# Patient Record
Sex: Male | Born: 1970 | Race: White | Hispanic: No | Marital: Single | Smoking: Never smoker
Health system: Southern US, Community
[De-identification: ages and names within clinical notes are randomized; demographics above are authoritative.]

## PROBLEM LIST (undated history)

## (undated) DIAGNOSIS — F329 Major depressive disorder, single episode, unspecified: Secondary | ICD-10-CM

## (undated) DIAGNOSIS — F419 Anxiety disorder, unspecified: Secondary | ICD-10-CM

## (undated) DIAGNOSIS — K219 Gastro-esophageal reflux disease without esophagitis: Secondary | ICD-10-CM

## (undated) DIAGNOSIS — F32A Depression, unspecified: Secondary | ICD-10-CM

## (undated) DIAGNOSIS — Q8500 Neurofibromatosis, unspecified: Secondary | ICD-10-CM

## (undated) HISTORY — DX: Major depressive disorder, single episode, unspecified: F32.9

## (undated) HISTORY — DX: Anxiety disorder, unspecified: F41.9

## (undated) HISTORY — DX: Depression, unspecified: F32.A

## (undated) HISTORY — DX: Gastro-esophageal reflux disease without esophagitis: K21.9

## (undated) HISTORY — DX: Neurofibromatosis, unspecified: Q85.00

---

## 2006-03-11 ENCOUNTER — Ambulatory Visit: Payer: Self-pay | Admitting: Gastroenterology

## 2006-03-11 LAB — HM COLONOSCOPY

## 2013-07-27 ENCOUNTER — Ambulatory Visit: Payer: Self-pay | Admitting: Family Medicine

## 2013-08-10 ENCOUNTER — Ambulatory Visit: Payer: Self-pay | Admitting: Family Medicine

## 2014-11-19 LAB — PSA

## 2014-12-23 ENCOUNTER — Telehealth: Payer: Self-pay | Admitting: Unknown Physician Specialty

## 2014-12-23 NOTE — Telephone Encounter (Signed)
Pt's mother called stated refill orders did not go through and need to be resubmitted to [facility]:  Buspirone Omeprazole  Thanks.

## 2014-12-24 ENCOUNTER — Other Ambulatory Visit: Payer: Self-pay | Admitting: Unknown Physician Specialty

## 2014-12-24 MED ORDER — OMEPRAZOLE 20 MG PO CPDR: 20.0000 mg | DELAYED_RELEASE_CAPSULE | Freq: Every day | ORAL | Status: DC

## 2014-12-24 MED ORDER — BUSPIRONE HCL 10 MG PO TABS: 10.0000 mg | ORAL_TABLET | Freq: Two times a day (BID) | ORAL | Status: DC

## 2014-12-24 NOTE — Telephone Encounter (Signed)
Medications sent to pharmacy to [facility]

## 2015-02-04 ENCOUNTER — Telehealth: Payer: Self-pay | Admitting: Unknown Physician Specialty

## 2015-02-04 NOTE — Telephone Encounter (Signed)
Will route to [place] to see if she can suggest anything.

## 2015-02-04 NOTE — Telephone Encounter (Signed)
Called and let patient's mother know what Elnita MaxwellCheryl suggested.

## 2015-02-04 NOTE — Telephone Encounter (Signed)
Pt's mother called stated pt has a severe dry cough, wants to know if Elnita MaxwellCheryl can recommend something for him to take. We have no appointments available until Tuesday of next week. Please call Rocco Sereneamela Boggs @ [phone]. Thanks.

## 2015-02-04 NOTE — Telephone Encounter (Signed)
Try OTC meds but also dark honey and sometimes dark chocolate works.  If it is so severe he needs to be seen, should got to urgent care.

## 2015-02-18 ENCOUNTER — Telehealth: Payer: Self-pay | Admitting: Unknown Physician Specialty

## 2015-02-18 MED ORDER — OMEPRAZOLE 20 MG PO CPDR: 20.0000 mg | DELAYED_RELEASE_CAPSULE | Freq: Every day | ORAL | Status: DC

## 2015-02-18 NOTE — Telephone Encounter (Signed)
Routing to provider  

## 2015-02-18 NOTE — Telephone Encounter (Addendum)
Already at 90.  Will send again

## 2015-02-18 NOTE — Addendum Note (Signed)
Addended by: Gabriel Cirri on: 02/18/2015 05:13 PM   Modules accepted: Orders

## 2015-02-18 NOTE — Telephone Encounter (Signed)
Pt's mother called stated there is an issue with her son's medication and we need to contact the pt's pharmacy @ [phone]. Omeprazole quantities need to be increased to a 90 supply as the pharmacy can not do 30 day supply. Thanks. Pt has not had this medication since 02/06/15.

## 2015-02-21 NOTE — Telephone Encounter (Signed)
done

## 2015-02-22 ENCOUNTER — Telehealth: Payer: Self-pay | Admitting: Unknown Physician Specialty

## 2015-02-22 MED ORDER — OMEPRAZOLE 20 MG PO CPDR: 20.0000 mg | DELAYED_RELEASE_CAPSULE | Freq: Every day | ORAL | Status: DC

## 2015-02-22 NOTE — Telephone Encounter (Signed)
Pt's mother called stated pharmacy has not received the RX for Prilosec. Pharm is [facility]. Thanks.

## 2015-02-23 ENCOUNTER — Telehealth: Payer: Self-pay | Admitting: Unknown Physician Specialty

## 2015-02-23 MED ORDER — OMEPRAZOLE 20 MG PO CPDR: 20.0000 mg | DELAYED_RELEASE_CAPSULE | Freq: Every day | ORAL | Status: DC

## 2015-02-23 NOTE — Telephone Encounter (Signed)
It sounds like they did a complete work-up at the urgent care.  I would refer to ENT.  I'll put the order in if OK with them

## 2015-02-23 NOTE — Telephone Encounter (Signed)
Routing to provider to see what she suggests.

## 2015-02-23 NOTE — Telephone Encounter (Signed)
Called and spoke to patient's mother, Rinaldo Cloud. She stated that the patient has not been taking his omeprazole and she believes this is why he is coughing because this has happened before. [facility] and they stated that the patient's insurance will only cover a 90 day supply of medication over a 180 day period. The patient was given 30 days worth in June so he is oly allowed 60 more pills for the next 5 months. [facility] suggested we do a prior authorization so the patient can get more to take every day.

## 2015-02-23 NOTE — Telephone Encounter (Signed)
pts mom called in and would like a call back because the cough her son had since 7/15. She took him to urgent care across the street and they did xrays and blood work and she's concerned and is wondering why he's not getting any better even after getting the cough medicines that were suggested on the 17th of July.

## 2015-02-24 NOTE — Telephone Encounter (Signed)
Pts mom called and said [facility] needs a quantity approval from cheryl.

## 2015-02-25 ENCOUNTER — Telehealth: Payer: Self-pay | Admitting: Unknown Physician Specialty

## 2015-02-25 NOTE — Telephone Encounter (Signed)
Pts mother called, she is tired of dealing with [facility] and would like [facility] [place]

## 2015-02-25 NOTE — Telephone Encounter (Signed)
[facility] [place] 

## 2015-03-02 ENCOUNTER — Telehealth: Payer: Self-pay

## 2015-03-02 MED ORDER — OMEPRAZOLE 20 MG PO CPDR: 20.0000 mg | DELAYED_RELEASE_CAPSULE | Freq: Every day | ORAL | Status: DC

## 2015-03-02 NOTE — Telephone Encounter (Signed)
Patient's mother called stating that she has been trying to talk to someone at [facility] but cannot get a human on the phone, its only the recording. Patient's mother is wanting the [facility]ep[facility]ole [facility] [place] because the patient has been out since 02/06/15 and really needs the medication. Patient's mother apologizes for all of the confusion.

## 2015-03-14 ENCOUNTER — Telehealth: Payer: Self-pay | Admitting: Unknown Physician Specialty

## 2015-03-14 MED ORDER — OMEPRAZOLE 20 MG PO CPDR: 20.0000 mg | DELAYED_RELEASE_CAPSULE | Freq: Every day | ORAL | Status: DC

## 2015-03-14 MED ORDER — OMEPRAZOLE 20 MG PO CPDR: 40.0000 mg | DELAYED_RELEASE_CAPSULE | Freq: Every day | ORAL | Status: DC

## 2015-03-14 NOTE — Telephone Encounter (Signed)
pts mom would like a call back regarding her sons acid reflux medication.

## 2015-03-14 NOTE — Telephone Encounter (Signed)
Spoke to patients mother. [facility] [place] and said that they stated they need a new rx sent in for the Omeprazole. It was just written on 03/02/15, but patient's mother stated she just talked to them.

## 2015-03-14 NOTE — Telephone Encounter (Signed)
[facility] needed [facility] RX for Omeprazole. [facility] [place]. Thanks

## 2015-03-22 ENCOUNTER — Telehealth: Payer: Self-pay

## 2015-03-22 NOTE — Telephone Encounter (Signed)
Patient's mother called and stated that they are still having problems with the patient's omeprazole. She does not want anything to do with [facility] anymore so can I call and discontinue the rx that they have? Mother also stated that [facility] [place] [facility] and stated they have 2 different rx's for the omeprazole, one says take one a day and the other says take two. They need clarification on that. The one in the chart says to take 2 capsules a day.

## 2015-03-22 NOTE — Telephone Encounter (Signed)
Yes you can.  I believe he takes 2 a day.

## 2015-03-22 NOTE — Telephone Encounter (Signed)
Called [facility] and dc the rx's for omeprazole. [facility] and told them the right dosage the patient is supposed to be taking and they stated that the rx is going to need a prior authorization.

## 2015-03-23 NOTE — Telephone Encounter (Signed)
Called and let patient's mother know that we are waiting on the prior authorization for the medication.

## 2015-03-23 NOTE — Telephone Encounter (Signed)
Prior authorization paperwork for the medication was faxed in, we filled it out, and sent it back.

## 2015-08-09 ENCOUNTER — Telehealth: Payer: Self-pay

## 2015-08-09 NOTE — Telephone Encounter (Signed)
Called and let patient's mother know he should just be taking one daily.

## 2015-08-09 NOTE — Telephone Encounter (Signed)
Patient's mother called asking about omeprazole prescription. It states that patient is supposed to take 2 capsules daily but they thought he was just supposed to take one capsule daily. They would like to know if he is supposed to be taking one or two capsules daily.

## 2015-08-09 NOTE — Telephone Encounter (Signed)
The once a day he has been taking is fine

## 2015-09-05 DIAGNOSIS — F329 Major depressive disorder, single episode, unspecified: Secondary | ICD-10-CM

## 2015-09-05 DIAGNOSIS — F32A Depression, unspecified: Secondary | ICD-10-CM

## 2015-09-05 DIAGNOSIS — F419 Anxiety disorder, unspecified: Principal | ICD-10-CM

## 2015-09-05 DIAGNOSIS — K219 Gastro-esophageal reflux disease without esophagitis: Secondary | ICD-10-CM | POA: Insufficient documentation

## 2015-09-05 DIAGNOSIS — E7800 Pure hypercholesterolemia, unspecified: Secondary | ICD-10-CM

## 2015-09-06 ENCOUNTER — Encounter: Payer: Self-pay | Admitting: Unknown Physician Specialty

## 2015-09-06 ENCOUNTER — Ambulatory Visit (INDEPENDENT_AMBULATORY_CARE_PROVIDER_SITE_OTHER): Payer: BC Managed Care – PPO | Admitting: Unknown Physician Specialty

## 2015-09-06 VITALS — BP 146/79 | HR 87 | Temp 98.0°F | Ht 69.5 in | Wt 211.2 lb

## 2015-09-06 DIAGNOSIS — Z23 Encounter for immunization: Secondary | ICD-10-CM

## 2015-09-06 DIAGNOSIS — R059 Cough, unspecified: Secondary | ICD-10-CM

## 2015-09-06 DIAGNOSIS — R05 Cough: Secondary | ICD-10-CM

## 2015-09-06 MED ORDER — BENZONATATE 200 MG PO CAPS: 200.0000 mg | ORAL_CAPSULE | Freq: Two times a day (BID) | ORAL | Status: DC | PRN

## 2015-09-06 MED ORDER — AZITHROMYCIN 250 MG PO TABS: ORAL_TABLET | ORAL | Status: DC

## 2015-09-06 MED ORDER — PREDNISONE 20 MG PO TABS: 20.0000 mg | ORAL_TABLET | Freq: Every day | ORAL | Status: DC

## 2015-09-06 NOTE — Progress Notes (Signed)
BP 146/79 mmHg  Pulse 87  Temp(Src) 98 F (36.7 C)  Ht 5' 9.5" (1.765 m)  Wt 211 lb 3.2 oz (95.8 kg)  BMI 30.75 kg/m2  SpO2 98%   Subjective:    Patient ID: Joshua Krause, male    DOB: 10-13-1970, 45 y.o.   MRN: [id number]  HPI: Joshua Krause is a 45 y.o. male  Chief Complaint  Patient presents with  . URI    pt states he has a cough, gagging, and congestion. States this has been going on since 07/22/15. Mom states pt was given antibiotics at Urgent care that seemed to help, but then symptoms came back. Patient has tried taking delsym, mucinex DM, and mometasone nasal spray.    URI  This is a recurrent (Never completely better from previous visit) problem. The current episode started more than 1 month ago. The problem has been gradually worsening. There has been no fever. Associated symptoms include congestion, coughing, ear pain and rhinorrhea. Pertinent negatives include no headaches, joint pain, rash or swollen glands. Treatments tried: antibiotics as above. The treatment provided mild relief.   States had an x-ray x2.  He does not smoke but his mother who he lives with does. His shop "is full of walking pneumonia."  Relevant past medical, surgical, family and social history reviewed and updated as indicated. Interim medical history since our last visit reviewed. Allergies and medications reviewed and updated.  Review of Systems  HENT: Positive for congestion, ear pain and rhinorrhea.   Respiratory: Positive for cough.   Musculoskeletal: Negative for joint pain.  Skin: Negative for rash.  Neurological: Negative for headaches.    Per HPI unless specifically indicated above     Objective:    BP 146/79 mmHg  Pulse 87  Temp(Src) 98 F (36.7 C)  Ht 5' 9.5" (1.765 m)  Wt 211 lb 3.2 oz (95.8 kg)  BMI 30.75 kg/m2  SpO2 98%  Wt Readings from Last 3 Encounters:  09/06/15 211 lb 3.2 oz (95.8 kg)  11/19/14 209 lb (94.802 kg)    Physical Exam   Constitutional: He is oriented to person, place, and time. He appears well-developed and well-nourished. No distress.  HENT:  Head: Normocephalic and atraumatic.  Right Ear: Tympanic membrane and ear canal normal.  Left Ear: Tympanic membrane and ear canal normal.  Nose: Rhinorrhea present. Right sinus exhibits no maxillary sinus tenderness and no frontal sinus tenderness. Left sinus exhibits no maxillary sinus tenderness and no frontal sinus tenderness.  Mouth/Throat: Uvula is midline. Posterior oropharyngeal edema present.  Eyes: Conjunctivae and lids are normal. Right eye exhibits no discharge. Left eye exhibits no discharge. No scleral icterus.  Neck: Neck supple.  Cardiovascular: Normal rate, regular rhythm and normal heart sounds.   Pulmonary/Chest: Effort normal and breath sounds normal. No respiratory distress.  Abdominal: Normal appearance. There is no splenomegaly or hepatomegaly.  Musculoskeletal: Normal range of motion.  Neurological: He is alert and oriented to person, place, and time.  Skin: Skin is warm, dry and intact. No rash noted. No pallor.  Psychiatric: He has a normal mood and affect. His behavior is normal. Judgment and thought content normal.  Nursing note and vitals reviewed.   Results for orders placed or performed in visit on 09/05/15  PSA  Result Value Ref Range   PSA from PP   HM COLONOSCOPY  Result Value Ref Range   HM Colonoscopy from PP       Assessment & Plan:  Problem List Items Addressed This Visit    None    Visit Diagnoses    Immunization due    -  Primary    Relevant Orders    Flu Vaccine QUAD 36+ mos IM (Completed)    Cough        r/o walking pneumonia.  Prednisone fro probable reactive airway        Follow up plan: Return if symptoms worsen or fail to improve.

## 2015-09-19 ENCOUNTER — Telehealth: Payer: Self-pay | Admitting: Unknown Physician Specialty

## 2015-09-19 DIAGNOSIS — I517 Cardiomegaly: Secondary | ICD-10-CM

## 2015-09-19 NOTE — Telephone Encounter (Signed)
Called and left patient's mother a voicemail asking if she could please return my call.

## 2015-09-19 NOTE — Telephone Encounter (Signed)
Patient's mother called back. I let her know what was going on with the patient's x-ray results. She asked if she would be able to sign the release and I told her yes because she is the patient's power of attorney. She stated she would come by in the morning to sign the release.

## 2015-09-19 NOTE — Telephone Encounter (Signed)
Pts mom called in and would like to have Joshua Krause take a look at the xrays taken at [facility] to see if he may possibly have an enlarged heart. While at urgent care they asked the pts mom if he he has ever been told that he had an enlarged heart and she would like to speak with his PCP to find out what to do next. He may possibly have allergies and currently has an ear infection. Ms [name] would just like a call back to know the next step and find out if her son will need to go see a cardiologist.

## 2015-09-19 NOTE — Telephone Encounter (Signed)
Routing to provider  

## 2015-09-19 NOTE — Telephone Encounter (Signed)
I can't see a chest x-ray being done at [facility].  Can we call for it?

## 2015-09-19 NOTE — Telephone Encounter (Signed)
[facility] to see if they could fax [place] a copy of the patient's chest x-ray. They stated that we had to fax them a release of information signed by the patient first. They provided me their fax number so I will call the patient to see if they can come in to sign the release.

## 2015-09-20 NOTE — Telephone Encounter (Signed)
Patient's mother came by, signed the release, and it was faxed to [facility]. I we do not receive results within the next 2 days, I will call them and find out what is going on.

## 2015-09-22 NOTE — Telephone Encounter (Signed)
Called patient's mother to let her know the status of the x-ray results. I told her that we would call her once we got them and Elnita Maxwell had a chance to look at them.

## 2015-09-22 NOTE — Telephone Encounter (Signed)
[facility]. They stated that they mailed out the patient's chest-xray results on 09/20/15. I asked how long it usually took to get them and they stated we should get them today. If we do not get them by the end of the week, I will call and see what is going on.

## 2015-09-23 NOTE — Telephone Encounter (Signed)
We got the patient's x-ray results on a disc. Joshua MaxwellCheryl brought the disc to me and said that she cannot see the disc. I tried to call [facility] to see if they could fax me paper copies of the x-ray results but there was no answer. The phone rang and then went to a busy signal. I will try to call again later.

## 2015-09-23 NOTE — Telephone Encounter (Signed)
[facility] They transferred me to the medical records department and told me that they faxed over the patient's information we requested on 09/20/15. I told her that we had not gotten anything through fax on this patient. She read out the fax number that started with [phone] and I asked her if she could re-fax the information using the [phone] number. She stated she would re-send the information now.

## 2015-09-28 NOTE — Telephone Encounter (Signed)
[facility] because I have not gotten any faxes from them yet. They said that the only thing they could send us was the disc that they have already sent. They said they cannot send paper copies of the chest x-ray radiology reports. I asked if they knew why we could not see the reports on care everywhere. They said only certain things cross over to us for us to be able to see. The lady I spoke to transferred me to somebody else (I cant remember what department it was). But the last person I spoke with gave me a number to the medical records information department to call, [phone]. So I will call them and see what we can do.

## 2015-09-29 NOTE — Telephone Encounter (Signed)
Called and informed patient's mother of everything that is going on. I let her know that I am currently waiting on [facility].

## 2015-09-29 NOTE — Telephone Encounter (Signed)
Called the number that was provided yesterday. I gave the lady a run down of what was going on. I told her that the patient's mother signed a release form that we faxed to them on 09/19/15. I told her that we got the disc that was sent to us but we cannot see a disc. I asked if we could get the radiology report faxed to us and the lady asked for our fax number, my name, and said she would send it over now.

## 2015-09-29 NOTE — Telephone Encounter (Signed)
Results from [facility] were received through the fax. I will give them to [facility] when she comes in tomorrow morning for her to review.

## 2015-09-30 NOTE — Telephone Encounter (Signed)
Report was given to [facility] and she stated she would call the patient's mother. [phone]- Rocco SerenePamela Boggs

## 2015-09-30 NOTE — Telephone Encounter (Signed)
Discussed with mother chest x-ray showing mild cardiomegaly.  Set up for echocardiogram

## 2015-10-06 ENCOUNTER — Ambulatory Visit
Admission: RE | Admit: 2015-10-06 | Discharge: 2015-10-06 | Disposition: A | Discharge status: Home / Self Care | Payer: BC Managed Care – PPO | Source: Ambulatory Visit | Attending: Unknown Physician Specialty | Admitting: Unknown Physician Specialty

## 2015-10-06 DIAGNOSIS — I517 Cardiomegaly: Secondary | ICD-10-CM

## 2015-10-06 NOTE — Progress Notes (Signed)
*  PRELIMINARY RESULTS* Echocardiogram 2D Echocardiogram has been performed.  Georgann HousekeeperJerry R Hege 10/06/2015, 11:14 AM

## 2015-10-07 ENCOUNTER — Telehealth: Payer: Self-pay | Admitting: Family Medicine

## 2015-10-07 NOTE — Telephone Encounter (Signed)
Called and spoke to patient's mother. I gave her the results and she stated that the patient still has a cough and wonders if it is allergies. She wanted me to schedule patient an [facility]. So I scheduled an appointment for 10/24/15.

## 2015-10-07 NOTE — Telephone Encounter (Signed)
Please let him know that his echo came back normal. Thanks!

## 2015-10-19 ENCOUNTER — Other Ambulatory Visit: Payer: Self-pay | Admitting: Unknown Physician Specialty

## 2015-10-19 NOTE — Telephone Encounter (Signed)
Cheryl's pt with rx request

## 2015-10-19 NOTE — Telephone Encounter (Signed)
Your patient 

## 2015-10-24 ENCOUNTER — Ambulatory Visit (INDEPENDENT_AMBULATORY_CARE_PROVIDER_SITE_OTHER): Payer: BC Managed Care – PPO | Admitting: Unknown Physician Specialty

## 2015-10-24 ENCOUNTER — Encounter: Payer: Self-pay | Admitting: Unknown Physician Specialty

## 2015-10-24 VITALS — BP 121/74 | HR 90 | Temp 98.2°F | Ht 69.2 in | Wt 213.2 lb

## 2015-10-24 DIAGNOSIS — J302 Other seasonal allergic rhinitis: Secondary | ICD-10-CM | POA: Diagnosis not present

## 2015-10-24 NOTE — Progress Notes (Signed)
BP 121/74 mmHg  Pulse 90  Temp(Src) 98.2 F (36.8 C)  Ht 5' 9.2" (1.758 m)  Wt 213 lb 3.2 oz (96.707 kg)  BMI 31.29 kg/m2  SpO2 96%   Subjective:    Patient ID: Joshua Krause, male    DOB: [birth year], 45 y.o.   MRN: [id number]  HPI: Joshua SharperChristopher B Ignatowski is a 45 y.o. male  Chief Complaint  Patient presents with  . Allergies   URI  This is a new problem. The current episode started more than 1 month ago. The problem has been gradually improving. There has been no fever. Associated symptoms include congestion, sneezing and wheezing. Pertinent negatives include no diarrhea, ear pain, headaches, nausea, plugged ear sensation, rash, rhinorrhea, sinus pain, sore throat or swollen glands. He has tried antihistamine for the symptoms. The treatment provided moderate relief.    Takes Allegra every day with no issues.   Relevant past medical, surgical, family and social history reviewed and updated as indicated. Interim medical history since our last visit reviewed. Allergies and medications reviewed and updated.  Review of Systems  HENT: Positive for congestion and sneezing. Negative for ear pain, rhinorrhea and sore throat.   Respiratory: Positive for wheezing.   Gastrointestinal: Negative for nausea and diarrhea.  Skin: Negative for rash.  Neurological: Negative for headaches.    Per HPI unless specifically indicated above     Objective:    BP 121/74 mmHg  Pulse 90  Temp(Src) 98.2 F (36.8 C)  Ht 5' 9.2" (1.758 m)  Wt 213 lb 3.2 oz (96.707 kg)  BMI 31.29 kg/m2  SpO2 96%  Wt Readings from Last 3 Encounters:  10/24/15 213 lb 3.2 oz (96.707 kg)  09/06/15 211 lb 3.2 oz (95.8 kg)  11/19/14 209 lb (94.802 kg)    Physical Exam  Constitutional: He is oriented to person, place, and time. He appears well-developed and well-nourished. No distress.  HENT:  Head: Normocephalic and atraumatic.  Right Ear: Tympanic membrane and ear canal normal.  Left Ear: Tympanic membrane  and ear canal normal.  Nose: Mucosal edema and rhinorrhea present. Right sinus exhibits no maxillary sinus tenderness and no frontal sinus tenderness. Left sinus exhibits no maxillary sinus tenderness and no frontal sinus tenderness.  Mouth/Throat: Uvula is midline. Mucous membranes are pale.  Eyes: Conjunctivae and lids are normal. Right eye exhibits no discharge. Left eye exhibits no discharge. No scleral icterus.  Neck: Neck supple.  Cardiovascular: Normal rate, regular rhythm and normal heart sounds.   Pulmonary/Chest: Effort normal and breath sounds normal. No respiratory distress.  Abdominal: Normal appearance. There is no splenomegaly or hepatomegaly.  Musculoskeletal: Normal range of motion.  Neurological: He is alert and oriented to person, place, and time.  Skin: Skin is warm, dry and intact. No rash noted. No pallor.  Psychiatric: He has a normal mood and affect. His behavior is normal. Judgment and thought content normal.  Nursing note and vitals reviewed.   Results for orders placed or performed in visit on 09/05/15  PSA  Result Value Ref Range   PSA from PP   HM COLONOSCOPY  Result Value Ref Range   HM Colonoscopy from PP       Assessment & Plan:   Problem List Items Addressed This Visit    None    Visit Diagnoses    Seasonal allergies    -  Primary    Patient doing well with daily Allegra. Continue present medication.  Follow up plan: Return if symptoms worsen or fail to improve.

## 2015-11-07 ENCOUNTER — Other Ambulatory Visit: Payer: Self-pay

## 2015-11-07 MED ORDER — FEXOFENADINE HCL 180 MG PO TABS: 180.0000 mg | ORAL_TABLET | Freq: Every day | ORAL | Status: DC

## 2015-11-07 NOTE — Telephone Encounter (Signed)
I had called patient's mother about an encounter in her chart. While on the phone, she asked about a refill for her son's fexofenadine. She stated that they thought it was being sent at his recent appointment but it was not. [facility]

## 2015-12-16 ENCOUNTER — Other Ambulatory Visit: Payer: Self-pay | Admitting: Unknown Physician Specialty

## 2016-02-20 ENCOUNTER — Other Ambulatory Visit: Payer: Self-pay | Admitting: Unknown Physician Specialty

## 2016-02-28 ENCOUNTER — Other Ambulatory Visit: Payer: Self-pay | Admitting: Unknown Physician Specialty

## 2016-03-19 ENCOUNTER — Telehealth: Payer: Self-pay

## 2016-03-19 MED ORDER — BUSPIRONE HCL 10 MG PO TABS: 10.0000 mg | ORAL_TABLET | Freq: Two times a day (BID) | ORAL | 12 refills | Status: DC

## 2016-03-19 MED ORDER — OMEPRAZOLE 20 MG PO CPDR: 20.0000 mg | DELAYED_RELEASE_CAPSULE | Freq: Two times a day (BID) | ORAL | 12 refills | Status: DC

## 2016-03-19 NOTE — Telephone Encounter (Signed)
Pharmacy sent a fax requesting refills on omeprazole and buspirone. Only one month of these meds are being sent in at a time. Is this how they are supposed to be?

## 2016-05-26 ENCOUNTER — Other Ambulatory Visit: Payer: Self-pay | Admitting: Unknown Physician Specialty

## 2016-06-18 ENCOUNTER — Other Ambulatory Visit: Payer: Self-pay | Admitting: Family Medicine

## 2016-07-13 ENCOUNTER — Other Ambulatory Visit: Payer: Self-pay | Admitting: Unknown Physician Specialty

## 2016-07-13 NOTE — Telephone Encounter (Signed)
Refill request

## 2016-08-13 ENCOUNTER — Telehealth: Payer: Self-pay | Admitting: Unknown Physician Specialty

## 2016-08-13 NOTE — Telephone Encounter (Signed)
Called and spoke to patient's mother. She stated that she would like for [facility] to look over the patient's notes [facility] She stated that [facility]. Patient's mother also wanted me to cancel her appointment with Elnita Maxwellheryl on Wednesday so she could stay with him so I did that. I was looking in the patient's chart and noticed that I did not see an [facility]. I asked patient's mother about this and she stated that she took the patient to the walk in beside the hopsital [facility] I explained to the patient's mother that we are supposed to see patient's within 30 days of being discharged from the hospital. She stated that she would call when the patient is discharged and schedule this follow up and she would talk with Elnita Maxwellheryl then. Patient's mother stated that for now, she just wanted Elnita MaxwellCheryl to look over the patient's records when she could.

## 2016-08-13 NOTE — Telephone Encounter (Signed)
Pts mom would like a call back regarding his visit [facility]. [facility] but they are performing tests that she unfamiliar with and would like your input on what's going on.

## 2016-08-16 ENCOUNTER — Telehealth: Payer: Self-pay | Admitting: Unknown Physician Specialty

## 2016-08-16 NOTE — Telephone Encounter (Signed)
Dr.Johnson, the other medicine in question is the Buspirone, not ibuprofen. Is it ok for him to resume taking that or should he wait until he finishes the new antibiotic?

## 2016-08-16 NOTE — Telephone Encounter (Signed)
According to [facility], patient was to stop taking cipro, start taking cefpodoxime (vantin) BID for 12 days, and continue taking bupropion and omeprazole. Is this correct? [facility] and Dr. Laural BenesJohnson in Cheryl's absence since this is concerning a patient taking his medications.

## 2016-08-16 NOTE — Telephone Encounter (Signed)
Patient was at the hospital on Sunday, they were [facility] that he should not be taking his regular meds while taking his antibiotic.  She is wanting to know if this is correct.  Please advise.  Thank You Clydie BraunKaren

## 2016-08-16 NOTE — Telephone Encounter (Signed)
He should keep taking his omeprazole and ibuprofen as needed. Should not still take the cipro and replace that with the [facility] (the [facility]) The he can see Elnita Maxwellheryl on Tuesday.

## 2016-08-16 NOTE — Telephone Encounter (Signed)
Patient's mom notified.

## 2016-08-16 NOTE — Telephone Encounter (Signed)
That's fine to take

## 2016-08-17 NOTE — Telephone Encounter (Signed)
Notes reviewed. Thanks

## 2016-08-21 ENCOUNTER — Encounter: Payer: Self-pay | Admitting: Unknown Physician Specialty

## 2016-08-21 ENCOUNTER — Ambulatory Visit (INDEPENDENT_AMBULATORY_CARE_PROVIDER_SITE_OTHER): Payer: BC Managed Care – PPO | Admitting: Unknown Physician Specialty

## 2016-08-21 VITALS — BP 128/64 | HR 60 | Temp 98.1°F | Ht 70.5 in | Wt 216.4 lb

## 2016-08-21 DIAGNOSIS — N12 Tubulo-interstitial nephritis, not specified as acute or chronic: Secondary | ICD-10-CM

## 2016-08-21 DIAGNOSIS — Z23 Encounter for immunization: Secondary | ICD-10-CM

## 2016-08-21 LAB — UA/M W/RFLX CULTURE, ROUTINE
BILIRUBIN UA: NEGATIVE
GLUCOSE, UA: NEGATIVE
Ketones, UA: NEGATIVE
LEUKOCYTES UA: NEGATIVE
Nitrite, UA: NEGATIVE
PH UA: 6 (ref 5.0–7.5)
PROTEIN UA: NEGATIVE
RBC, UA: NEGATIVE
SPEC GRAV UA: 1.015 (ref 1.005–1.030)
Urobilinogen, Ur: 0.2 mg/dL (ref 0.2–1.0)

## 2016-08-21 NOTE — Patient Instructions (Addendum)
Tdap Vaccine (Tetanus, Diphtheria and Pertussis): What You Need to Know 1. Why get vaccinated? Tetanus, diphtheria and pertussis are very serious diseases. Tdap vaccine can protect us from these diseases. And, Tdap vaccine given to pregnant women can protect newborn babies against pertussis. TETANUS (Lockjaw) is rare in the [place] today. It causes painful muscle tightening and stiffness, usually all over the body.  It can lead to tightening of muscles in the head and neck so you can't open your mouth, swallow, or sometimes even breathe. Tetanus kills about 1 out of 10 people who are infected even after receiving the best medical care.  DIPHTHERIA is also rare in the [place] today. It can cause a thick coating to form in the back of the throat.  It can lead to breathing problems, heart failure, paralysis, and death.  PERTUSSIS (Whooping Cough) causes severe coughing spells, which can cause difficulty breathing, vomiting and disturbed sleep.  It can also lead to weight loss, incontinence, and rib fractures. Up to 2 in 100 adolescents and 5 in 100 adults with pertussis are hospitalized or have complications, which could include pneumonia or death.  These diseases are caused by bacteria. Diphtheria and pertussis are spread from person to person through secretions from coughing or sneezing. Tetanus enters the body through cuts, scratches, or wounds. Before vaccines, as many as 200,000 cases of diphtheria, 200,000 cases of pertussis, and hundreds of cases of tetanus, were reported in the [place] each year. Since vaccination began, reports of cases for tetanus and diphtheria have dropped by about 99% and for pertussis by about 80%. 2. Tdap vaccine Tdap vaccine can protect adolescents and adults from tetanus, diphtheria, and pertussis. One dose of Tdap is routinely given at age 11 or 12. People who did not get Tdap at that age should get it as soon as possible. Tdap is especially  important for healthcare professionals and anyone having close contact with a baby younger than 12 months. Pregnant women should get a dose of Tdap during every pregnancy, to protect the newborn from pertussis. Infants are most at risk for severe, life-threatening complications from pertussis. Another vaccine, called Td, protects against tetanus and diphtheria, but not pertussis. A Td booster should be given every 10 years. Tdap may be given as one of these boosters if you have never gotten Tdap before. Tdap may also be given after a severe cut or burn to prevent tetanus infection. Your doctor or the person giving you the vaccine can give you more information. Tdap may safely be given at the same time as other vaccines. 3. Some people should not get this vaccine  A person who has ever had a life-threatening allergic reaction after a previous dose of any diphtheria, tetanus or pertussis containing vaccine, OR has a severe allergy to any part of this vaccine, should not get Tdap vaccine. Tell the person giving the vaccine about any severe allergies.  Anyone who had coma or long repeated seizures within 7 days after a childhood dose of DTP or DTaP, or a previous dose of Tdap, should not get Tdap, unless a cause other than the vaccine was found. They can still get Td.  Talk to your doctor if you: ? have seizures or another nervous system problem, ? had severe pain or swelling after any vaccine containing diphtheria, tetanus or pertussis, ? ever had a condition called Guillain-Barr Syndrome (GBS), ? aren't feeling well on the day the shot is scheduled. 4. Risks With any medicine, including   vaccines, there is a chance of side effects. These are usually mild and go away on their own. Serious reactions are also possible but are rare. Most people who get Tdap vaccine do not have any problems with it. Mild problems following Tdap: (Did not interfere with activities)  Pain where the shot was given (about  3 in 4 adolescents or 2 in 3 adults)  Redness or swelling where the shot was given (about 1 person in 5)  Mild fever of at least 100.4F (up to about 1 in 25 adolescents or 1 in 100 adults)  Headache (about 3 or 4 people in 10)  Tiredness (about 1 person in 3 or 4)  Nausea, vomiting, diarrhea, stomach ache (up to 1 in 4 adolescents or 1 in 10 adults)  Chills, sore joints (about 1 person in 10)  Body aches (about 1 person in 3 or 4)  Rash, swollen glands (uncommon)  Moderate problems following Tdap: (Interfered with activities, but did not require medical attention)  Pain where the shot was given (up to 1 in 5 or 6)  Redness or swelling where the shot was given (up to about 1 in 16 adolescents or 1 in 12 adults)  Fever over 102F (about 1 in 100 adolescents or 1 in 250 adults)  Headache (about 1 in 7 adolescents or 1 in 10 adults)  Nausea, vomiting, diarrhea, stomach ache (up to 1 or 3 people in 100)  Swelling of the entire arm where the shot was given (up to about 1 in 500).  Severe problems following Tdap: (Unable to perform usual activities; required medical attention)  Swelling, severe pain, bleeding and redness in the arm where the shot was given (rare).  Problems that could happen after any vaccine:  People sometimes faint after a medical procedure, including vaccination. Sitting or lying down for about 15 minutes can help prevent fainting, and injuries caused by a fall. Tell your doctor if you feel dizzy, or have vision changes or ringing in the ears.  Some people get severe pain in the shoulder and have difficulty moving the arm where a shot was given. This happens very rarely.  Any medication can cause a severe allergic reaction. Such reactions from a vaccine are very rare, estimated at fewer than 1 in a million doses, and would happen within a few minutes to a few hours after the vaccination. As with any medicine, there is a very remote chance of a vaccine  causing a serious injury or death. The safety of vaccines is always being monitored. For more information, visit: www.cdc.gov/vaccinesafety/ 5. What if there is a serious problem? What should I look for? Look for anything that concerns you, such as signs of a severe allergic reaction, very high fever, or unusual behavior. Signs of a severe allergic reaction can include hives, swelling of the face and throat, difficulty breathing, a fast heartbeat, dizziness, and weakness. These would usually start a few minutes to a few hours after the vaccination. What should I do?  If you think it is a severe allergic reaction or other emergency that can't wait, call 9-1-1 or get the person to the nearest hospital. Otherwise, call your doctor.  Afterward, the reaction should be reported to the Vaccine Adverse Event Reporting System (VAERS). Your doctor might file this report, or you can do it yourself through the VAERS web site at www.vaers.hhs.gov, or by calling 1-800-822-7967. ? VAERS does not give medical advice. 6. The National Vaccine Injury Compensation Program The National   Vaccine Injury Compensation Program (VICP) is a federal program that was created to compensate people who may have been injured by certain vaccines. Persons who believe they may have been injured by a vaccine can learn about the program and about filing a claim by calling 1-800-338-2382 or visiting the VICP website at www.hrsa.gov/vaccinecompensation. There is a time limit to file a claim for compensation. 7. How can I learn more?  Ask your doctor. He or she can give you the vaccine package insert or suggest other sources of information.  Call your local or state health department.  Contact the Centers for Disease Control and Prevention (CDC): ? Call 1-800-232-4636 (1-800-CDC-INFO) or ? Visit CDC's website at www.cdc.gov/vaccines CDC Tdap Vaccine VIS (09/15/13) This information is not intended to replace advice given to you by your  health care provider. Make sure you discuss any questions you have with your health care provider. Document Released: 01/08/2012 Document Revised: 03/29/2016 Document Reviewed: 03/29/2016 Elsevier Interactive Patient Education  2017 Elsevier Inc.  

## 2016-08-21 NOTE — Progress Notes (Signed)
   BP 128/64 (BP Location: Left Arm, Patient Position: Sitting, Cuff Size: Large)   Pulse 60   Temp 98.1 F (36.7 C)   Ht 5' 10.5" (1.791 m)   Wt 216 lb 6.4 oz (98.2 kg)   SpO2 98%   BMI 30.61 kg/m    Subjective:    Patient ID: Joshua Krause, male    DOB: 1971-04-10, 46 y.o.   MRN: [id number]  HPI: Joshua Krause is a 46 y.o. male  Chief Complaint  Patient presents with  . Hospitalization Follow-up    pt states [facility] in his kidneys. Pt and his mother state that his medications say kidney problems on side effects.    Pyelonephritis [facility] fiver.  He was diagnosed with pyelonephritis.  He completed his antibiotics and states his fever is resolved.     Relevant past medical, surgical, family and social history reviewed and updated as indicated. Interim medical history since our last visit reviewed. Allergies and medications reviewed and updated.  Review of Systems  Per HPI unless specifically indicated above     Objective:    BP 128/64 (BP Location: Left Arm, Patient Position: Sitting, Cuff Size: Large)   Pulse 60   Temp 98.1 F (36.7 C)   Ht 5' 10.5" (1.791 m)   Wt 216 lb 6.4 oz (98.2 kg)   SpO2 98%   BMI 30.61 kg/m   Wt Readings from Last 3 Encounters:  08/21/16 216 lb 6.4 oz (98.2 kg)  10/24/15 213 lb 3.2 oz (96.7 kg)  09/06/15 211 lb 3.2 oz (95.8 kg)    Physical Exam  Constitutional: He is oriented to person, place, and time. He appears well-developed and well-nourished. No distress.  HENT:  Head: Normocephalic and atraumatic.  Eyes: Conjunctivae and lids are normal. Right eye exhibits no discharge. Left eye exhibits no discharge. No scleral icterus.  Neck: Normal range of motion. Neck supple. No JVD present. Carotid bruit is not present.  Cardiovascular: Normal rate, regular rhythm and normal heart sounds.   Pulmonary/Chest: Effort normal and breath sounds normal. No respiratory distress.    Abdominal: Normal appearance. There is no splenomegaly or hepatomegaly.  Musculoskeletal: Normal range of motion.  Neurological: He is alert and oriented to person, place, and time.  Skin: Skin is warm, dry and intact. No rash noted. No pallor.  Psychiatric: He has a normal mood and affect. His behavior is normal. Judgment and thought content normal.   ER notes reviewed.  CMP normal    Assessment & Plan:   Problem List Items Addressed This Visit    None    Visit Diagnoses    Need for diphtheria-tetanus-pertussis (Tdap) vaccine, adult/adolescent    -  Primary   Relevant Orders   Tdap vaccine greater than or equal to 7yo IM (Completed)   Pyelonephritis       Refer to Urology for evaluation of possible cause.     Relevant Medications   cefpodoxime (VANTIN) 200 MG tablet   Other Relevant Orders   UA/M w/rflx Culture, Routine   Ambulatory referral to Urology       Follow up plan: Return if symptoms worsen or fail to improve.

## 2016-09-07 ENCOUNTER — Encounter: Payer: Self-pay | Admitting: Urology

## 2016-09-07 ENCOUNTER — Other Ambulatory Visit
Admission: RE | Admit: 2016-09-07 | Discharge: 2016-09-07 | Disposition: A | Discharge status: Home / Self Care | Payer: BC Managed Care – PPO | Source: Ambulatory Visit | Attending: Urology | Admitting: Urology

## 2016-09-07 ENCOUNTER — Ambulatory Visit (INDEPENDENT_AMBULATORY_CARE_PROVIDER_SITE_OTHER): Payer: BC Managed Care – PPO | Admitting: Urology

## 2016-09-07 VITALS — BP 144/77 | HR 54 | Ht 68.0 in | Wt 214.0 lb

## 2016-09-07 DIAGNOSIS — R3129 Other microscopic hematuria: Secondary | ICD-10-CM

## 2016-09-07 DIAGNOSIS — N138 Other obstructive and reflux uropathy: Secondary | ICD-10-CM

## 2016-09-07 DIAGNOSIS — N401 Enlarged prostate with lower urinary tract symptoms: Secondary | ICD-10-CM

## 2016-09-07 DIAGNOSIS — N12 Tubulo-interstitial nephritis, not specified as acute or chronic: Secondary | ICD-10-CM | POA: Diagnosis not present

## 2016-09-07 LAB — URINALYSIS, COMPLETE (UACMP) WITH MICROSCOPIC
BILIRUBIN URINE: NEGATIVE
Bacteria, UA: NONE SEEN
Glucose, UA: NEGATIVE mg/dL
HGB URINE DIPSTICK: NEGATIVE
Ketones, ur: NEGATIVE mg/dL
Nitrite: NEGATIVE
PROTEIN: NEGATIVE mg/dL
Specific Gravity, Urine: 1.015 (ref 1.005–1.030)
pH: 5.5 (ref 5.0–8.0)

## 2016-09-07 LAB — BLADDER SCAN AMB NON-IMAGING: Scan Result: 86

## 2016-09-07 NOTE — Progress Notes (Signed)
09/07/2016 10:41 AM   Joshua Krause 02-22-1971 [id number]  Referring provider: Kathrine Haddock, NP [place], [place] [address]  Chief Complaint  Patient presents with  . New Patient (Initial Visit)    Pyelonephritis referred by Dr. 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  Patient stated that prior to the hospitalization he was having urgency and frequency associated with a fever on and off for 2 days.  He was tried to manage his symptoms at home alternating ibuprofen and Tylenol but then sought treatment endocrine normal clinic's urgent care.  He was given Rocephin IM and oral ciprofloxacin. UA was positive for > 50 WBC's and > 50 RBC's.  Urine culture from the urgent care growing Escherichia coli.  Patient could not tolerate the ciprofloxacin and presented to [facility] for further treatment.  At [facility] he  met 4/4 SIRS criteria with WBC 24.9. CT A/P withno stone or concern for hydronephrosis. UA appeared dirty, but culture was negative likely given previous treatment. Patient markedly improved after 24 hrs of IV antibiotics. Transitioned to oral third generation cephalosporin given response (cefpodoxime) and will plan to complete a 14 day course.   Today, he complains of nocturia 1. This is baseline symptomatology. He is no longer experiencing frequency, urgency, flank pain or fevers. His I PSS score was 1/2.  His PVR was 86 mL. He denies any gross hematuria, dysuria or suprapubic pain.  He is not having chills, nausea or vomiting.        IPSS    Row Name 09/07/16 1000         International Prostate Symptom Score   How often have you had the sensation of not emptying your bladder? Not at All     How often have you had to urinate less than every two hours? Not at All     How often have you found you stopped and started  again several times when you urinated? Not at All     How often have you found it difficult to postpone urination? Not at All     How often have you had a weak urinary stream? Not at All     How often have you had to strain to start urination? Not at All     How many times did you typically get up at night to urinate? 1 Time     Total IPSS Score 1       Quality of Life due to urinary symptoms   If you were to spend the rest of your life with your urinary condition just the way it is now how would you feel about that? Mostly Satisfied        Score:  1-7 Mild 8-19 Moderate 20-35 Severe  PMH: Past Medical History:  Diagnosis Date  . Anxiety   . Depression   . GERD (gastroesophageal reflux disease)   . Neurofibromatosis [facility])     Surgical History: History reviewed. No pertinent surgical history.  Home Medications:  Allergies as of 09/07/2016      Reactions   Meloxicam Other (See Comments)   Stomach pain      Medication List       Accurate as of 09/07/16 10:41 AM. Always use your most recent med list.          busPIRone 10 MG tablet Commonly known as:  BUSPAR Take 1 tablet (10 mg total) by mouth 2 (two) times daily.   fexofenadine 180 MG tablet Commonly known as:  ALLEGRA TAKE 1 TABLET (180 MG TOTAL) BY MOUTH DAILY.   omeprazole 20 MG capsule Commonly known as:  PRILOSEC TAKE 2 CAPSULES (40 MG TOTAL) BY MOUTH DAILY.       Allergies:  Allergies  Allergen Reactions  . Meloxicam Other (See Comments)    Stomach pain    Family History: Family History  Problem Relation Age of Onset  . Diabetes Maternal Grandmother   . Hypertension Maternal Grandmother   . Prostate cancer Neg Hx   . Kidney cancer Neg Hx     Social History:  reports that he has never smoked. His smokeless tobacco use includes Chew. He reports that he drinks alcohol. He reports that he does not use drugs.  ROS: UROLOGY Frequent Urination?: No Hard to postpone urination?:  No Burning/pain with urination?: No Get up at night to urinate?: Yes Leakage of urine?: No Urine stream starts and stops?: No Trouble starting stream?: No Do you have to strain to urinate?: No Blood in urine?: No Urinary tract infection?: No Sexually transmitted disease?: No Injury to kidneys or bladder?: No Painful intercourse?: No Weak stream?: No Erection problems?: No Penile pain?: No  Gastrointestinal Nausea?: Yes Vomiting?: Yes Indigestion/heartburn?: No Diarrhea?: No Constipation?: No  Constitutional Fever: No Night sweats?: No Weight loss?: No Fatigue?: No  Skin Skin rash/lesions?: No Itching?: No  Eyes Blurred vision?: No Double vision?: No  Ears/Nose/Throat Sore throat?: No Sinus problems?: No  Hematologic/Lymphatic Swollen glands?: No Easy bruising?: No  Cardiovascular Leg swelling?: No Chest pain?: No  Respiratory Cough?: No Shortness of breath?: No  Endocrine Excessive thirst?: No  Musculoskeletal Back pain?: No Joint pain?: No  Neurological Headaches?: No Dizziness?: No  Psychologic Depression?: No Anxiety?: No  Physical Exam: BP (!) 144/77   Pulse (!) 54   Ht 5' 8"  (1.727 m)   Wt 214 lb (97.1 kg)   BMI 32.54 kg/m   Constitutional: Well nourished. Alert and oriented, No acute distress. HEENT: [place] AT, moist mucus membranes. Trachea midline, no masses. Cardiovascular: No clubbing, cyanosis, or edema. Respiratory: Normal respiratory effort, no increased work of breathing. GI: Abdomen is soft, non tender, non distended, no abdominal masses. Liver and spleen not palpable.  No hernias appreciated.  Stool sample for occult testing is not indicated.   GU: No CVA tenderness.  No bladder fullness or masses.  Patient with circumcised phallus.  Urethral meatus is patent.  No penile discharge. No penile lesions or rashes. Scrotum without lesions, cysts, rashes and/or edema.  Testicles are located scrotally bilaterally. No masses are  appreciated in the testicles. Left and right epididymis are normal. Rectal: Patient with  normal sphincter tone. Anus and perineum without scarring or rashes. No rectal masses are appreciated. Prostate is approximately 45 grams, no nodules are appreciated. Seminal vesicles are normal. Skin: No rashes, bruises or suspicious lesions. Lymph: No cervical or inguinal adenopathy. Neurologic: Grossly intact, no focal deficits, moving all 4 extremities. Psychiatric: Normal mood and affect.  Laboratory Data:  Lab Results  Component Value Date   PSA from [facility] 11/19/2014   Urinalysis Not available at time of visit  Pertinent Imaging: Results for KEINAN, BROUILLET (MRN [id number]) as of 09/07/2016 10:37  Ref. Range 09/07/2016 10:10  Scan Result Unknown [id number]    Assessment & Plan:    1. Pyelonephritis  - resolved  2. Microscopic hematuria  -  associated with UTI  - recheck UA today, if hematuria persists will pursue hematuria workup  3. BPH with LUTS  - IPSS score is 1/2  - Continue conservative management, avoiding bladder irritants and timed voiding's  - RTC in 12 months for IPS'S, PVR and exam   - BLADDER SCAN AMB NON-IMAGING   Return in about 1 year (around 09/07/2017) for IPSS, PVR and exam.  These notes generated with voice recognition software. I apologize for typographical errors.  Zara Council, [facility] [address] [place], [place] [address] [phone]

## 2016-09-10 ENCOUNTER — Telehealth: Payer: Self-pay

## 2016-09-10 DIAGNOSIS — R3129 Other microscopic hematuria: Secondary | ICD-10-CM

## 2016-09-10 NOTE — Telephone Encounter (Signed)
-----   Message from Harle BattiestShannon A McGowan, PA-C sent at 09/09/2016 10:41 AM EST ----- Please let the patient and his mother know that there was microscopic blood in his urine.  We need to schedule a CT Urogram at this time.

## 2016-09-10 NOTE — Telephone Encounter (Signed)
Patient's mother notified and order placed for CTscan to be done in [place], and transferred to the front to make a follow up with Carollee HerterShannon to go over CTscan results per shannon hold off on cysto for now.

## 2016-09-13 ENCOUNTER — Ambulatory Visit
Admission: RE | Admit: 2016-09-13 | Discharge: 2016-09-13 | Disposition: A | Discharge status: Home / Self Care | Payer: BC Managed Care – PPO | Source: Ambulatory Visit | Attending: Urology | Admitting: Urology

## 2016-09-13 DIAGNOSIS — R3129 Other microscopic hematuria: Secondary | ICD-10-CM | POA: Diagnosis not present

## 2016-09-13 MED ORDER — IOPAMIDOL (ISOVUE-300) INJECTION 61%
150.0000 mL | Freq: Once | INTRAVENOUS | Status: AC | PRN
  Administered 2016-09-13: 125 mL via INTRAVENOUS

## 2016-09-17 ENCOUNTER — Telehealth: Payer: Self-pay | Admitting: Urology

## 2016-09-17 NOTE — Telephone Encounter (Signed)
Okay 

## 2016-09-17 NOTE — Telephone Encounter (Signed)
Pt's mom had pt in the [facility].  They were supposed to do a CT.  I told pt you should be able to see this in Care Everywhere.  Pt has an appt 3/2 in [place].

## 2016-09-20 ENCOUNTER — Other Ambulatory Visit: Payer: Self-pay | Admitting: *Deleted

## 2016-09-20 DIAGNOSIS — R3129 Other microscopic hematuria: Secondary | ICD-10-CM

## 2016-09-21 ENCOUNTER — Ambulatory Visit (INDEPENDENT_AMBULATORY_CARE_PROVIDER_SITE_OTHER): Payer: BC Managed Care – PPO | Admitting: Urology

## 2016-09-21 ENCOUNTER — Other Ambulatory Visit
Admission: RE | Admit: 2016-09-21 | Discharge: 2016-09-21 | Disposition: A | Discharge status: Home / Self Care | Payer: BC Managed Care – PPO | Source: Ambulatory Visit | Attending: Urology | Admitting: Urology

## 2016-09-21 ENCOUNTER — Encounter: Payer: Self-pay | Admitting: Urology

## 2016-09-21 VITALS — BP 139/79 | HR 63 | Ht 68.0 in | Wt 219.0 lb

## 2016-09-21 DIAGNOSIS — N401 Enlarged prostate with lower urinary tract symptoms: Secondary | ICD-10-CM

## 2016-09-21 DIAGNOSIS — R3129 Other microscopic hematuria: Secondary | ICD-10-CM | POA: Diagnosis not present

## 2016-09-21 DIAGNOSIS — N138 Other obstructive and reflux uropathy: Secondary | ICD-10-CM | POA: Diagnosis not present

## 2016-09-21 DIAGNOSIS — N12 Tubulo-interstitial nephritis, not specified as acute or chronic: Secondary | ICD-10-CM

## 2016-09-21 LAB — URINALYSIS, COMPLETE (UACMP) WITH MICROSCOPIC
BILIRUBIN URINE: NEGATIVE
Bacteria, UA: NONE SEEN
GLUCOSE, UA: NEGATIVE mg/dL
Ketones, ur: NEGATIVE mg/dL
Leukocytes, UA: NEGATIVE
NITRITE: NEGATIVE
Protein, ur: NEGATIVE mg/dL
RBC / HPF: NONE SEEN RBC/hpf (ref 0–5)
SPECIFIC GRAVITY, URINE: 1.01 (ref 1.005–1.030)
Squamous Epithelial / LPF: NONE SEEN
WBC UA: NONE SEEN WBC/hpf (ref 0–5)
pH: 7 (ref 5.0–8.0)

## 2016-09-21 NOTE — Progress Notes (Signed)
09/21/2016 11:05 AM   Joshua Krause [birth year] [id number]  Referring provider: Kathrine Haddock, NP [place], [place] [address]  Chief Complaint  Patient presents with  . 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  Patient stated that prior to the hospitalization he was having urgency and frequency associated with a fever on and off for 2 days.  He was tried to manage his symptoms at home alternating ibuprofen and Tylenol but then sought treatment endocrine normal clinic's urgent care.  He was given Rocephin IM and oral ciprofloxacin. UA was positive for > 50 WBC's and > 50 RBC's.  Urine culture from the urgent care growing Escherichia coli.  Patient could not tolerate the ciprofloxacin and presented to [facility] for further treatment.  At [facility] he  met 4/4 SIRS criteria with WBC 24.9. CT A/P with no stone or concern for hydronephrosis. UA appeared dirty, but culture was negative likely given previous treatment. Patient markedly improved after 24 hrs of IV antibiotics. Transitioned to oral third generation cephalosporin given response (cefpodoxime) and will plan to complete a 14 day course.    At his last visit, he complains of nocturia 1.  This is baseline symptomatology. He is no longer experiencing frequency, urgency, flank pain or fevers. His I PSS score was 1/2.  His PVR was 86 mL. He denies any gross hematuria, dysuria or suprapubic pain.  He is not having chills, nausea or vomiting.    CT Urogram performed on 09/13/2016 noted no explanation for hematuria. No nephrolithiasis, ureterolithiasis, enhancing renal cortical lesion, or filling defects within the  collecting systems.  No bladder stones or filling defects in the bladder which does not excluded a bladder lesion.  I have independently reviewed the films.    Today, he is complaining of frequency, nocturia and intermittency.  He denies dysuria, gross hematuria and suprapubic pain.  He denies fevers, chills, nausea and vomiting.  His mother states that she had noticed the patient stopping his stream during urination.  Patient admits to stopping his stream during urination, but he would not elaborate why he would do this.  UA was unremarkable at today's visit.       IPSS    Row Name 09/07/16 1000         International Prostate Symptom Score   How often have you had the sensation of not emptying your bladder? Not at All     How often have you had to urinate less than every two hours? Not at All     How often have you found you stopped and started again several times when you urinated? Not at All     How often have you found it difficult to postpone urination? Not at All     How often have you had a weak urinary stream? Not at All     How often have you had to strain to start urination? Not at All     How many times did you typically get up at night to urinate? 1 Time     Total IPSS Score 1       Quality of Life due to urinary symptoms   If  you were to spend the rest of your life with your urinary condition just the way it is now how would you feel about that? Mostly Satisfied        Score:  1-7 Mild 8-19 Moderate 20-35 Severe  PMH: Past Medical History:  Diagnosis Date  . Anxiety   . Depression   . GERD (gastroesophageal reflux disease)   . Neurofibromatosis [facility])     Surgical History: History reviewed. No pertinent surgical history.  Home Medications:  Allergies as of 09/21/2016      Reactions   Meloxicam Other (See Comments)   Stomach pain      Medication List       Accurate as of 09/21/16 11:05 AM. Always use your most recent med list.          busPIRone 10 MG  tablet Commonly known as:  BUSPAR Take 1 tablet (10 mg total) by mouth 2 (two) times daily.   fexofenadine 180 MG tablet Commonly known as:  ALLEGRA TAKE 1 TABLET (180 MG TOTAL) BY MOUTH DAILY.   omeprazole 20 MG capsule Commonly known as:  PRILOSEC TAKE 2 CAPSULES (40 MG TOTAL) BY MOUTH DAILY.       Allergies:  Allergies  Allergen Reactions  . Meloxicam Other (See Comments)    Stomach pain    Family History: Family History  Problem Relation Age of Onset  . Diabetes Maternal Grandmother   . Hypertension Maternal Grandmother   . Prostate cancer Neg Hx   . Kidney cancer Neg Hx   . Bladder Cancer Neg Hx     Social History:  reports that he has never smoked. His smokeless tobacco use includes Chew. He reports that he drinks alcohol. He reports that he does not use drugs.  ROS: UROLOGY Frequent Urination?: Yes Hard to postpone urination?: No Burning/pain with urination?: No Get up at night to urinate?: Yes Leakage of urine?: No Urine stream starts and stops?: Yes Trouble starting stream?: No Do you have to strain to urinate?: No Blood in urine?: No Urinary tract infection?: No Sexually transmitted disease?: No Injury to kidneys or bladder?: No Painful intercourse?: No Weak stream?: No Erection problems?: No Penile pain?: No  Gastrointestinal Nausea?: No Vomiting?: No Indigestion/heartburn?: No Diarrhea?: No Constipation?: No  Constitutional Fever: No Night sweats?: No Weight loss?: No Fatigue?: No  Skin Skin rash/lesions?: No Itching?: No  Eyes Blurred vision?: No Double vision?: No  Ears/Nose/Throat Sore throat?: No Sinus problems?: No  Hematologic/Lymphatic Swollen glands?: No Easy bruising?: No  Cardiovascular Leg swelling?: No Chest pain?: No  Respiratory Cough?: No Shortness of breath?: No  Endocrine Excessive thirst?: No  Musculoskeletal Back pain?: No Joint pain?: No  Neurological Headaches?: No Dizziness?:  No  Psychologic Depression?: No Anxiety?: Yes  Physical Exam: BP 139/79   Pulse 63   Ht 5' 8"  (1.727 m)   Wt 219 lb (99.3 kg)   BMI 33.30 kg/m   Constitutional: Well nourished. Alert and oriented, No acute distress. HEENT: [place] AT, moist mucus membranes. Trachea midline, no masses. Cardiovascular: No clubbing, cyanosis, or edema. Respiratory: Normal respiratory effort, no increased work of breathing. Skin: No rashes, bruises or suspicious lesions. Lymph: No cervical or inguinal adenopathy. Neurologic: Grossly intact, no focal deficits, moving all 4 extremities. Psychiatric: Normal mood and affect.  Laboratory Data:  Lab Results  Component Value Date   PSA from [facility] 11/19/2014   Urinalysis Unremarkable.  See EPIC.    Pertinent Imaging: CLINICAL DATA:  Microscopic hematuria  EXAM: CT ABDOMEN AND PELVIS WITHOUT AND WITH CONTRAST  TECHNIQUE: Multidetector CT imaging of the abdomen and pelvis was performed following the standard protocol before and following the bolus administration of intravenous contrast.  CONTRAST:  135m ISOVUE-300 IOPAMIDOL (ISOVUE-300) INJECTION 61%  COMPARISON:  None.  FINDINGS: Lower chest:  Lung bases are clear.  Hepatobiliary: No focal hepatic lesion. Normal gallbladder. No duct dilatation.  Pancreas: Normal pancreatic parenchymal intensity. No ductal dilatation or inflammation.  Spleen: Normal spleen.  Adrenals/urinary tract: Adrenal glands are normal. No nephrolithiasis or ureterolithiasis. No enhancing renal cortical lesion. No filling defects within the ureters.  No bladder calculi, enhancing bladder lesions, or filling defect within the bladder.  Stomach/Bowel: Stomach and limited of the small bowel is unremarkable  Vascular/Lymphatic: Abdominal aortic normal caliber. No retroperitoneal periportal lymphadenopathy.  Musculoskeletal: No aggressive osseous lesion  IMPRESSION: 1. No explanation for hematuria.  No nephrolithiasis, ureterolithiasis, enhancing renal cortical lesion, or filling defects within the collecting systems. 2. No bladder stones or filling defects in the bladder which does not excluded a bladder lesion.   Electronically Signed   By: SSuzy BouchardM.D.   On: 09/13/2016 15:23  Assessment & Plan:    1. Pyelonephritis  - resolved  2. Microscopic hematuria  - associated with UTI  - CT Urogram did not identify any worrisome lesions  -advised patient that there is a  2% to 5% risk with microscopic hematuria of missing a bladder cancer - patient and his mother understand the risk  3. BPH with LUTS  - IPSS score is 1/2  - Continue conservative management, avoiding bladder irritants and timed voiding's  - RTC in 12 months for I PSS, PVR and exam   - BLADDER SCAN AMB NON-IMAGING   Return in about 1 year (around 09/21/2017) for IPSS, PVR and exam.  These notes generated with voice recognition software. I apologize for typographical errors.  [facility]ara [facility] [address] [place] [phone]

## 2016-09-22 LAB — URINE CULTURE: CULTURE: NO GROWTH

## 2016-10-11 ENCOUNTER — Other Ambulatory Visit: Payer: Self-pay | Admitting: Family Medicine

## 2016-11-02 ENCOUNTER — Ambulatory Visit (INDEPENDENT_AMBULATORY_CARE_PROVIDER_SITE_OTHER): Payer: BC Managed Care – PPO | Admitting: Unknown Physician Specialty

## 2016-11-02 ENCOUNTER — Encounter: Payer: Self-pay | Admitting: Unknown Physician Specialty

## 2016-11-02 VITALS — BP 110/70 | HR 92 | Temp 100.6°F | Wt 213.0 lb

## 2016-11-02 DIAGNOSIS — R05 Cough: Secondary | ICD-10-CM | POA: Diagnosis not present

## 2016-11-02 DIAGNOSIS — R509 Fever, unspecified: Secondary | ICD-10-CM | POA: Diagnosis not present

## 2016-11-02 DIAGNOSIS — R059 Cough, unspecified: Secondary | ICD-10-CM

## 2016-11-02 DIAGNOSIS — B9789 Other viral agents as the cause of diseases classified elsewhere: Secondary | ICD-10-CM | POA: Diagnosis not present

## 2016-11-02 DIAGNOSIS — J069 Acute upper respiratory infection, unspecified: Secondary | ICD-10-CM

## 2016-11-02 LAB — UA/M W/RFLX CULTURE, ROUTINE
Bilirubin, UA: NEGATIVE
GLUCOSE, UA: NEGATIVE
KETONES UA: NEGATIVE
Leukocytes, UA: NEGATIVE
NITRITE UA: NEGATIVE
SPEC GRAV UA: 1.01 (ref 1.005–1.030)
UUROB: 0.2 mg/dL (ref 0.2–1.0)
pH, UA: 5.5 (ref 5.0–7.5)

## 2016-11-02 LAB — VERITOR FLU A/B WAIVED
INFLUENZA B: NEGATIVE
Influenza A: NEGATIVE

## 2016-11-02 LAB — MICROSCOPIC EXAMINATION
Bacteria, UA: NONE SEEN
RBC MICROSCOPIC, UA: NONE SEEN /HPF (ref 0–?)
WBC UA: NONE SEEN /HPF (ref 0–?)

## 2016-11-02 MED ORDER — AZITHROMYCIN 250 MG PO TABS: ORAL_TABLET | ORAL | 0 refills | Status: DC

## 2016-11-02 NOTE — Progress Notes (Signed)
BP 110/70 (BP Location: Left Arm, Patient Position: Sitting, Cuff Size: Large)   Pulse 92   Temp (!) 100.6 F (38.1 C)   Wt 213 lb (96.6 kg)   SpO2 96%   BMI 32.39 kg/m    Subjective:    Patient ID: Joshua Krause, male    DOB: September 17, 1970, 46 y.o.   MRN: [id number]  HPI: Worley Radermacher is a 46 y.o. male  Chief Complaint  Patient presents with  . URI    pt states she has a cough, congestion, and tiredness since Tuesday. Mom states she has given the patient delsym, flonase, and ibuprofen.    Pt here with mother who gives part of the history  URI   This is a new problem. Episode onset: for 3 days. The problem has been gradually improving. There has been no fever. The fever has been present for 3 to 4 days. Associated symptoms include congestion, coughing, headaches and rhinorrhea. He has tried nothing for the symptoms. The treatment provided no relief.   Note, last time he ran a fever was for a UTI and seen at [facility]  Family History  Problem Relation Age of Onset  . Diabetes Maternal Grandmother   . Hypertension Maternal Grandmother   . Prostate cancer Neg Hx   . Kidney cancer Neg Hx   . Bladder Cancer Neg Hx    Family History  Problem Relation Age of Onset  . Diabetes Maternal Grandmother   . Hypertension Maternal Grandmother   . Prostate cancer Neg Hx   . Kidney cancer Neg Hx   . Bladder Cancer Neg Hx    Social History   Social History  . Marital status: Single    Spouse name: N/A  . Number of children: N/A  . Years of education: N/A   Occupational History  . Not on file.   Social History Main Topics  . Smoking status: Never Smoker  . Smokeless tobacco: Current User    Types: Chew  . Alcohol use 0.0 oz/week     Comment: on occasion  . Drug use: No  . Sexual activity: Not on file   Other Topics Concern  . Not on file   Social History Narrative  . No narrative on file   Past Medical History:  Diagnosis Date  . Anxiety   .  Depression   . GERD (gastroesophageal reflux disease)   . Neurofibromatosis (HCC)      Relevant past medical, surgical, family and social history reviewed and updated as indicated. Interim medical history since our last visit reviewed. Allergies and medications reviewed and updated.  Review of Systems  HENT: Positive for congestion and rhinorrhea.   Respiratory: Positive for cough.   Neurological: Positive for headaches.    Per HPI unless specifically indicated above     Objective:    BP 110/70 (BP Location: Left Arm, Patient Position: Sitting, Cuff Size: Large)   Pulse 92   Temp (!) 100.6 F (38.1 C)   Wt 213 lb (96.6 kg)   SpO2 96%   BMI 32.39 kg/m   Wt Readings from Last 3 Encounters:  11/02/16 213 lb (96.6 kg)  09/21/16 219 lb (99.3 kg)  09/07/16 214 lb (97.1 kg)    Physical Exam  Constitutional: He is oriented to person, place, and time. He appears well-developed and well-nourished. No distress.  HENT:  Head: Normocephalic and atraumatic.  Right Ear: Tympanic membrane and ear canal normal.  Left Ear: Tympanic membrane and  ear canal normal.  Nose: Rhinorrhea present. Right sinus exhibits no maxillary sinus tenderness and no frontal sinus tenderness. Left sinus exhibits no maxillary sinus tenderness and no frontal sinus tenderness.  Mouth/Throat: Uvula is midline. Posterior oropharyngeal edema present.  Eyes: Conjunctivae and lids are normal. Right eye exhibits no discharge. Left eye exhibits no discharge. No scleral icterus.  Neck: Neck supple.  Cardiovascular: Normal rate, regular rhythm and normal heart sounds.   Pulmonary/Chest: Effort normal and breath sounds normal. No respiratory distress.  Abdominal: Normal appearance. There is no splenomegaly or hepatomegaly.  Musculoskeletal: Normal range of motion.  Neurological: He is alert and oriented to person, place, and time.  Skin: Skin is warm, dry and intact. No rash noted. No pallor.  Psychiatric: He has a  normal mood and affect. His behavior is normal. Judgment and thought content normal.  Nursing note and vitals reviewed.  Urine negative Flu negative    Assessment & Plan:   Problem List Items Addressed This Visit    None    Visit Diagnoses    Cough    -  Primary   worsening.  Rx for Zpack   Relevant Orders   Veritor Flu A/B Waived   Viral upper respiratory tract infection       New problem.  R/O pneumonia as unable to get chest x-ray before the weekend.  Rx given for zithromax   Relevant Medications   azithromycin (ZITHROMAX Z-PAK) 250 MG tablet   Fever, unspecified fever cause       r/o pneumonia   Relevant Orders   UA/M w/rflx Culture, Routine        Follow up plan: Return if symptoms worsen or fail to improve.

## 2017-01-15 ENCOUNTER — Other Ambulatory Visit: Payer: Self-pay | Admitting: Family Medicine

## 2017-02-20 ENCOUNTER — Telehealth: Payer: Self-pay | Admitting: Unknown Physician Specialty

## 2017-02-20 NOTE — Telephone Encounter (Signed)
Please let her know that a steroid, like prednisone, injection would be appropriate in his circumstance.  X-ray is consistent with arthritis in the knee.  If she has specific questions about the medication she should contact the clinic.  What she "hears" is probably not sound medical advice.

## 2017-02-20 NOTE — Telephone Encounter (Signed)
Patient's mom returned my call and I let her know what Joshua MaxwellCheryl said.

## 2017-02-20 NOTE — Telephone Encounter (Signed)
Patient's mother would like for provider to call [facility] [phone]), to see what patient has been taking in regards to medication and injections for patient.    Facility is saying it went from an abscess to arthritis and mother is worried.   Please Advise.  Thank you

## 2017-02-20 NOTE — Telephone Encounter (Signed)
Called and left patient's mother a VM asking for her to please return my call.  

## 2017-02-20 NOTE — Telephone Encounter (Signed)
Called and spoke to patient's mom. I asked for clarification on what she is wanting to know. She states that the patient was seen at [facility] for a knee injury that happened at work. She states that the patient was seen 12/20/16 and 02/05/17 (12/20/16 visit is in the patient's chart, no documentation for 02/05/17). She states that the patient was given some kind of shot on 02/05/17 in his knee. She states that she was talking with some of her friends about this and they told her that if they gave the patient prednisone that it was not a good medication. I explained to the patient's mother that I could not see any documentation from the patient being seen 02/05/17 like I could for 12/20/16. Patient's mother states she is worried and would like for Elnita MaxwellCheryl to look at the patient's x-ray result and she what she thinks. She states that the patient was told to take Armond Hangaleve is he needed to.

## 2017-03-20 ENCOUNTER — Telehealth: Payer: Self-pay | Admitting: Unknown Physician Specialty

## 2017-03-20 NOTE — Telephone Encounter (Signed)
Pateint was told there is some growth under his knee cap. Patient's mother would like for Provider to call [facility] and look at the papers for patient. Patient's mother would like for provider to look at the results and know if what the other facility is saying is correct.  Please Advise.  Thank you

## 2017-03-20 NOTE — Telephone Encounter (Signed)
Called patient's mother. She states that it is supposed to be [employer], not [place]. Patient's mother gave me the number of [phone]. Will call them.

## 2017-03-20 NOTE — Telephone Encounter (Signed)
Called and spoke to patient's mother. I asked if she could call and have them send us the patient's records because they will not release them without a signed release or verbal release. Patient's mother states that they have permission to send us information already. Will call [facility] and see if they can send us the patient OV notes and images.

## 2017-03-20 NOTE — Telephone Encounter (Signed)
Called and spoke with receptionist at [facility]. I explained what was going on and she transferred me to Trish at [phone], because she is the nurse there for the provider that the patient sees. I left her a VM asking for her to please return my call.

## 2017-03-20 NOTE — Telephone Encounter (Signed)
Called [facility] and spoke with the medical records department. They state that there is no patient there with that name or birthday. Will call patient's mother back and find out if it is a different place or see if she has a phone number.

## 2017-03-22 NOTE — Telephone Encounter (Signed)
Called and left Bethann Berkshirerisha a VM letting her know out fax number so she could fax over the x-ray reports and OV notes.

## 2017-03-22 NOTE — Telephone Encounter (Signed)
Had a VM on my phone once I returned this morning from Trish at [facility]. She states that she can send the OV note and x-ray report but needs our fax number. Will call her shortly to provide that information.

## 2017-03-26 NOTE — Telephone Encounter (Signed)
Called and left Trish another VM requesting OV note and x-ray report on the patient. Left our fax and phone number on the VM.

## 2017-03-27 NOTE — Telephone Encounter (Signed)
[facility] note received from [facility]. Will give to Elnita MaxwellCheryl so she can take a look at.

## 2017-03-27 NOTE — Telephone Encounter (Signed)
Called and let patient's mother know what Elnita MaxwellCheryl said. She stated that she trusts Cheryl's opinion.

## 2017-03-27 NOTE — Telephone Encounter (Signed)
Let her know I know the NP who saw him last and he was excellent.  He does have some arthritis in his knee.  The PT option is appropriate treatment

## 2017-04-15 ENCOUNTER — Other Ambulatory Visit: Payer: Self-pay | Admitting: Unknown Physician Specialty

## 2017-04-23 ENCOUNTER — Other Ambulatory Visit: Payer: Self-pay | Admitting: Unknown Physician Specialty

## 2017-05-06 ENCOUNTER — Emergency Department
Admission: EM | Admit: 2017-05-06 | Discharge: 2017-05-06 | Disposition: A | Discharge status: Home / Self Care | Payer: BC Managed Care – PPO | Attending: Emergency Medicine | Admitting: Emergency Medicine

## 2017-05-06 DIAGNOSIS — F17220 Nicotine dependence, chewing tobacco, uncomplicated: Secondary | ICD-10-CM | POA: Diagnosis not present

## 2017-05-06 DIAGNOSIS — Z79899 Other long term (current) drug therapy: Secondary | ICD-10-CM | POA: Insufficient documentation

## 2017-05-06 DIAGNOSIS — M545 Low back pain, unspecified: Secondary | ICD-10-CM

## 2017-05-06 MED ORDER — HYDROCODONE-ACETAMINOPHEN 5-325 MG PO TABS
2.0000 | ORAL_TABLET | Freq: Once | ORAL | Status: AC
  Administered 2017-05-06: 2 via ORAL
  Filled 2017-05-06: qty 2

## 2017-05-06 MED ORDER — ORPHENADRINE CITRATE 30 MG/ML IJ SOLN
60.0000 mg | Freq: Two times a day (BID) | INTRAMUSCULAR | Status: DC
  Administered 2017-05-06: 60 mg via INTRAMUSCULAR
  Filled 2017-05-06: qty 2

## 2017-05-06 MED ORDER — KETOROLAC TROMETHAMINE 10 MG PO TABS: 10.0000 mg | ORAL_TABLET | Freq: Four times a day (QID) | ORAL | 0 refills | Status: DC | PRN

## 2017-05-06 MED ORDER — METHOCARBAMOL 500 MG PO TABS
500.0000 mg | ORAL_TABLET | Freq: Four times a day (QID) | ORAL | 0 refills | Status: DC | PRN
Start: 2017-05-06 — End: 2017-09-20

## 2017-05-06 NOTE — ED Triage Notes (Signed)
Pt c/o lower back pain right and left sides upon awaking this AM. Denies injury. Denies urinary sx. Denies abdominal pain. Pt alert and oriented X4, active, cooperative, pt in NAD. RR even and unlabored, color WNL.

## 2017-05-06 NOTE — ED Provider Notes (Signed)
[facility] Emergency Department Provider Note  ____________________________________________   First MD Initiated Contact with Patient 05/06/17 1021     (approximate)  I have reviewed the triage vital signs and the nursing notes.   HISTORY  Chief Complaint Back Pain per patient and mother  HPI Joshua Krause is a 46 y.o. male is here complaining of low back pain bilaterally since waking up this morning. Patient has a history of back problems and  sees 2 doctors in [place].  Patient denies any injury to his back. He denies any radiation into his legs, paresthesias, urinary symptoms, or saddle anesthesias. He denies any incontinence of bowel or bladder. He took Aleve at 6 AM without any relief.currently he rates pain as an 8/10.pain is increased with movement.   Past Medical History:  Diagnosis Date  . Anxiety   . Depression   . GERD (gastroesophageal reflux disease)   . Neurofibromatosis [facility])     Patient Active Problem List   Diagnosis Date Noted  . Anxiety and depression 09/05/2015  . GERD (gastroesophageal reflux disease) 09/05/2015  . Hypercholesterolemia 09/05/2015    History reviewed. No pertinent surgical history.  Prior to Admission medications   Medication Sig Start Date End Date Taking? Authorizing Provider  busPIRone (BUSPAR) 10 MG tablet TAKE 1 TABLET (10 MG TOTAL) BY MOUTH 2 (TWO) TIMES DAILY. 04/23/17   Gabriel Cirri, NP  fexofenadine (ALLEGRA) 180 MG tablet TAKE 1 TABLET (180 MG TOTAL) BY MOUTH DAILY. 05/28/16   Gabriel Cirri, NP  ketorolac (TORADOL) 10 MG tablet Take 1 tablet (10 mg total) by mouth every 6 (six) hours as needed for moderate pain. 05/06/17   Tommi Rumps, PA-C  methocarbamol (ROBAXIN) 500 MG tablet Take 1 tablet (500 mg total) by mouth every 6 (six) hours as needed for muscle spasms. 05/06/17   Tommi Rumps, PA-C  omeprazole (PRILOSEC) 20 MG capsule TAKE 2 CAPSULES (40 MG TOTAL) BY MOUTH DAILY.  04/15/17   Steele Sizer, MD    Allergies Meloxicam  Family History  Problem Relation Age of Onset  . Diabetes Maternal Grandmother   . Hypertension Maternal Grandmother   . Prostate cancer Neg Hx   . Kidney cancer Neg Hx   . Bladder Cancer Neg Hx     Social History Social History  Substance Use Topics  . Smoking status: Never Smoker  . Smokeless tobacco: Current User    Types: Chew  . Alcohol use 0.0 oz/week     Comment: on occasion    Review of Systems Constitutional: No fever/chills Cardiovascular: Denies chest pain. Respiratory: Denies shortness of breath. Gastrointestinal: No abdominal pain.  No nausea, no vomiting.  No diarrhea.  No constipation. Genitourinary: Negative for dysuria. Musculoskeletal: positive for low back pain. Skin: Negative for rash. Neurological: Negative for  focal weakness or numbness. ____________________________________________   PHYSICAL EXAM:  VITAL SIGNS: ED Triage Vitals  Enc Vitals Group     BP 05/06/17 0941 135/74     Pulse Rate 05/06/17 0941 63     Resp 05/06/17 0941 18     Temp 05/06/17 0941 98.1 F (36.7 C)     Temp Source 05/06/17 0941 Oral     SpO2 05/06/17 0941 96 %     Weight 05/06/17 0942 213 lb (96.6 kg)     Height 05/06/17 0942  (1.778 m)     Head Circumference --      Peak Flow --      Pain  Score 05/06/17 0941 8     Pain Loc --      Pain Edu? --      Excl. in GC? --     Constitutional: Alert and oriented. Well appearing and in no acute distress. Eyes: Conjunctivae are normal.  Head: Atraumatic. Neck: No stridor.   Cardiovascular: Normal rate, regular rhythm. Grossly normal heart sounds.  Good peripheral circulation. Respiratory: Normal respiratory effort.  No retractions. Lungs CTAB. Gastrointestinal: Soft and nontender. No distention. No CVA tenderness. Musculoskeletal: examination of the back there is no gross deformity noted. Range of motion is guarded secondary to discomfort. There is moderate  tenderness on palpation of the lumbar paravertebral muscles bilaterally. Straight leg raises are negative. Good muscle strength bilaterally. Neurologic:  Normal speech and language. No gross focal neurologic deficits are appreciated. Reflexes are 2+ bilaterally. Gait was not tested secondary to patient's pain. Skin:  Skin is warm, dry and intact. No rash noted. Psychiatric: Mood and affect are normal. Speech and behavior are normal.  ____________________________________________   LABS (all labs ordered are listed, but only abnormal results are displayed)  Labs Reviewed - No data to display  RADIOLOGY Deferred. ____________________________________________   PROCEDURES  Procedure(s) performed: None  Procedures  Critical Care performed: No  ____________________________________________   INITIAL IMPRESSION / ASSESSMENT AND PLAN / ED COURSE   ----------------------------------------- 11:15 AM on 05/06/2017 ----------------------------------------- Patient voices after medication for his back is feeling much better and is able move without increased pain. Patient was also given a note for work. He was discharged with a prescription for Toradol 10 mg one tablet 4 times a day for 3 days along with Robaxin 500 mg 4 times a day for 3 days. He is encouraged to use ice or heat to his back as needed and to follow-up with Dr. Harlon Flor if any continued problems with his back.   __________________________________________   FINAL CLINICAL IMPRESSION(S) / ED DIAGNOSES  Final diagnoses:  Acute bilateral low back pain without sciatica      NEW MEDICATIONS STARTED DURING THIS VISIT:  New Prescriptions   KETOROLAC (TORADOL) 10 MG TABLET    Take 1 tablet (10 mg total) by mouth every 6 (six) hours as needed for moderate pain.   METHOCARBAMOL (ROBAXIN) 500 MG TABLET    Take 1 tablet (500 mg total) by mouth every 6 (six) hours as needed for muscle spasms.     Note:  This document was  prepared using Dragon voice recognition software and may include unintentional dictation errors.    Tommi Rumps, PA-C 05/06/17 1209    Dionne Bucy, MD 05/06/17 [phone]

## 2017-05-06 NOTE — Discharge Instructions (Signed)
Follow-up with Dr. Harlon Flor if any continued problems with your back. Take medication only as directed. You may also use ice or heat to your back as needed for comfort.

## 2017-05-07 ENCOUNTER — Telehealth: Payer: Self-pay | Admitting: Emergency Medicine

## 2017-05-07 NOTE — Telephone Encounter (Signed)
Patient's mother called and says the robaxin is on national backorder and not available. Per dr Scotty Court can call in baclofen 5 mg 3 times a day as needed for spasm/pain. Quantity of 12.  Called to [facility] at mother's request.

## 2017-05-30 ENCOUNTER — Telehealth: Payer: Self-pay | Admitting: Unknown Physician Specialty

## 2017-05-30 NOTE — Telephone Encounter (Signed)
Routing to provider  

## 2017-05-30 NOTE — Telephone Encounter (Signed)
Copied from CRM #[id number]. Topic: Quick Communication - See Telephone Encounter >> May 30, 2017 12:42 PM Diana EvesHoyt, Maryann B wrote: CRM for notification. 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 On 11/7 pt went to [facility] er couldn't get out of bed and they stated it was back spasms. Now the ortho doc is stating there is two bones broken in knee. They are wanting to do laser surgery on left knee. Pt would like you to [facility] and the [facility]o doc at [facility] (phone number [phone].) pt is unaware of how it went from a turned knee to arthiris and now having 2 broken bones in the knee. Pt is going for a consult at [facility] ortho on 11/20.  05/30/17.

## 2017-05-31 NOTE — Telephone Encounter (Signed)
It looks like he had a stress fracture.  This is often not evident until several weeks following the injury

## 2017-05-31 NOTE — Telephone Encounter (Signed)
Called and let patient's mother know what Elnita MaxwellCheryl said. Patient's mother states that she is going to have the notes from the ortho visit on 06/11/17 faxed over to us.

## 2017-07-15 ENCOUNTER — Other Ambulatory Visit: Payer: Self-pay

## 2017-07-15 MED ORDER — OMEPRAZOLE 20 MG PO CPDR: DELAYED_RELEASE_CAPSULE | ORAL | 0 refills | Status: DC

## 2017-08-10 ENCOUNTER — Other Ambulatory Visit: Payer: Self-pay | Admitting: Family Medicine

## 2017-08-13 ENCOUNTER — Encounter: Payer: Self-pay | Admitting: Family Medicine

## 2017-08-13 ENCOUNTER — Ambulatory Visit: Payer: BC Managed Care – PPO | Admitting: Family Medicine

## 2017-08-13 VITALS — BP 134/76 | HR 78 | Temp 97.9°F | Wt 219.3 lb

## 2017-08-13 DIAGNOSIS — J069 Acute upper respiratory infection, unspecified: Secondary | ICD-10-CM

## 2017-08-13 DIAGNOSIS — F419 Anxiety disorder, unspecified: Secondary | ICD-10-CM

## 2017-08-13 DIAGNOSIS — F329 Major depressive disorder, single episode, unspecified: Secondary | ICD-10-CM

## 2017-08-13 MED ORDER — AZITHROMYCIN 250 MG PO TABS: ORAL_TABLET | ORAL | 0 refills | Status: DC

## 2017-08-13 MED ORDER — PREDNISONE 10 MG PO TABS: ORAL_TABLET | ORAL | 0 refills | Status: DC

## 2017-08-13 NOTE — Patient Instructions (Signed)
Follow up as needed

## 2017-08-13 NOTE — Progress Notes (Signed)
BP 134/76 (BP Location: Left Arm, Patient Position: Sitting, Cuff Size: Large)   Pulse 78   Temp 97.9 F (36.6 C) (Oral)   Wt 219 lb 4.8 oz (99.5 kg)   SpO2 97%   BMI 31.47 kg/m    Subjective:    Patient ID: Joshua Krause, male    DOB: 1971/01/26, 47 y.o.   MRN: [id number]  HPI: Joshua Krause is a 47 y.o. male  Chief Complaint  Patient presents with  . Cough    x's 11 days. Dry cough. Chest tightness. Not taking any of his medications because patient is taking tylenol, cough syrup and mucinex.   . Nasal Congestion    Yellow sputum.    Hacking cough, congestion, chest tightness, wheezing, sore throat x 11 days. Denies fevers, chills, sweats, SOB. Taking mucinex fast max liquid, aleve with mild relief. No hx of pulmonary dz or smoking. UTD on flu shot.   Anxiety - stopped his buspar about a month and a half ago, feeling well without it. Does not want to be on it anymore if he doesn't have to be.   Past Medical History:  Diagnosis Date  . Anxiety   . Depression   . GERD (gastroesophageal reflux disease)   . 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 Marital status: Single    Spouse name: Not on file  . Number of children: Not on file  . Years of education: Not on file  . Highest education level: Not on file  Social Needs  . Financial resource strain: Not on file  . Food insecurity - worry: Not on file  . Food insecurity - inability: Not on file  . Transportation needs - medical: Not on file  . Transportation needs - non-medical: Not on file  Occupational History  . Not on file  Tobacco Use  . Smoking status: Never Smoker  . Smokeless tobacco: Current User    Types: Chew  Substance and Sexual Activity  . Alcohol use: Yes    Alcohol/week: 0.0 oz    Comment: on occasion  . Drug use: No  . Sexual activity: Not on file  Other Topics Concern  . Not on file  Social History Narrative  . Not on file    Relevant  past medical, surgical, family and social history reviewed and updated as indicated. Interim medical history since our last visit reviewed. Allergies and medications reviewed and updated.  Review of Systems  Constitutional: Negative.   HENT: Positive for congestion and sore throat.   Eyes: Negative.   Respiratory: Positive for cough, chest tightness and wheezing.   Cardiovascular: Negative.   Gastrointestinal: Negative.   Genitourinary: Negative.   Musculoskeletal: Negative.   Skin: Negative.   Neurological: Negative.   Psychiatric/Behavioral: Negative.    Per HPI unless specifically indicated above     Objective:    BP 134/76 (BP Location: Left Arm, Patient Position: Sitting, Cuff Size: Large)   Pulse 78   Temp 97.9 F (36.6 C) (Oral)   Wt 219 lb 4.8 oz (99.5 kg)   SpO2 97%   BMI 31.47 kg/m   Wt Readings from Last 3 Encounters:  08/13/17 219 lb 4.8 oz (99.5 kg)  05/06/17 213 lb (96.6 kg)  11/02/16 213 lb (96.6 kg)    Physical Exam  Constitutional: He is oriented to person, place, and time. He appears well-developed and well-nourished. No distress.  HENT:  Head: Atraumatic.  Eyes: Conjunctivae are  normal. Pupils are equal, round, and reactive to light.  Neck: Normal range of motion. Neck supple.  Cardiovascular: Normal rate and normal heart sounds.  Pulmonary/Chest: Effort normal and breath sounds normal. No respiratory distress.  Musculoskeletal: Normal range of motion.  Neurological: He is alert and oriented to person, place, and time.  Skin: Skin is warm and dry.  Psychiatric: He has a normal mood and affect. His behavior is normal.  Nursing note and vitals reviewed.     Assessment & Plan:   Problem List Items Addressed This Visit      Other   Anxiety and depression    Stopped his buspar about a month ago, continue d/c as long as feeling well off of it. Pt informed to let us know if he gets back to a point where he feels like he needs it.        Other  Visit Diagnoses    Upper respiratory tract infection, unspecified type    -  Primary   Will tx with zpack and prednisone taper. Continue OTC cough syrups and supportive care. F/u if worsening or no improvement   Relevant Medications   azithromycin (ZITHROMAX) 250 MG tablet       Follow up plan: Return if symptoms worsen or fail to improve.

## 2017-08-13 NOTE — Assessment & Plan Note (Signed)
Stopped his buspar about a month ago, continue d/c as long as feeling well off of it. Pt informed to let us know if he gets back to a point where he feels like he needs it.

## 2017-09-06 ENCOUNTER — Ambulatory Visit: Payer: BC Managed Care – PPO | Admitting: Urology

## 2017-09-20 ENCOUNTER — Other Ambulatory Visit
Admission: RE | Admit: 2017-09-20 | Discharge: 2017-09-20 | Disposition: A | Discharge status: Home / Self Care | Payer: BC Managed Care – PPO | Source: Ambulatory Visit | Attending: Urology | Admitting: Urology

## 2017-09-20 ENCOUNTER — Encounter: Payer: Self-pay | Admitting: Urology

## 2017-09-20 ENCOUNTER — Other Ambulatory Visit: Payer: Self-pay

## 2017-09-20 ENCOUNTER — Telehealth: Payer: Self-pay | Admitting: Urology

## 2017-09-20 ENCOUNTER — Ambulatory Visit (INDEPENDENT_AMBULATORY_CARE_PROVIDER_SITE_OTHER): Payer: BC Managed Care – PPO | Admitting: Urology

## 2017-09-20 VITALS — BP 137/68 | HR 67 | Ht 70.0 in | Wt 220.0 lb

## 2017-09-20 DIAGNOSIS — N401 Enlarged prostate with lower urinary tract symptoms: Secondary | ICD-10-CM | POA: Diagnosis not present

## 2017-09-20 DIAGNOSIS — R3129 Other microscopic hematuria: Secondary | ICD-10-CM | POA: Insufficient documentation

## 2017-09-20 DIAGNOSIS — N138 Other obstructive and reflux uropathy: Secondary | ICD-10-CM

## 2017-09-20 LAB — URINALYSIS, COMPLETE (UACMP) WITH MICROSCOPIC
BILIRUBIN URINE: NEGATIVE
Bacteria, UA: NONE SEEN
Glucose, UA: NEGATIVE mg/dL
KETONES UR: NEGATIVE mg/dL
LEUKOCYTES UA: NEGATIVE
NITRITE: NEGATIVE
PH: 6.5 (ref 5.0–8.0)
Protein, ur: NEGATIVE mg/dL
SPECIFIC GRAVITY, URINE: 1.02 (ref 1.005–1.030)
SQUAMOUS EPITHELIAL / LPF: NONE SEEN

## 2017-09-20 NOTE — Progress Notes (Signed)
09/20/2017 9:11 AM   Joshua Krause 10/01/1970 [id number]  Referring provider: Kathrine Haddock, NP [address], [place] [address]  No chief complaint on file.   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     Background history Patient is a 47 year old Caucasian male with neurofibromatosis who presents today with his mother, Joshua Krause, for follow-up after hospitalization for pyelonephritis.  Patient stated that prior to the hospitalization he was having urgency and frequency associated with a fever on and off for 2 days.  He was tried to manage his symptoms at home alternating ibuprofen and Tylenol but then sought treatment endocrine normal clinic's urgent care.  He was given Rocephin IM and oral ciprofloxacin. UA was positive for > 50 WBC's and > 50 RBC's.  Urine culture from the urgent care growing Escherichia coli.  Patient could not tolerate the ciprofloxacin and presented to [facility] for further treatment.  At [facility] he  met 4/4 SIRS criteria with WBC 24.9. CT A/P with no stone or concern for hydronephrosis. UA appeared dirty, but culture was negative likely given previous treatment. Patient markedly improved after 24 hrs of IV antibiotics. Transitioned to oral third generation cephalosporin given response (cefpodoxime) and will plan to complete a 14 day course.    CT Urogram performed on 09/13/2016 noted no explanation for hematuria. No nephrolithiasis, ureterolithiasis, enhancing renal cortical lesion, or filling defects within the collecting systems.  No bladder stones or filling defects in the bladder which does not excluded a bladder lesion.    BPH WITH LUTS  (prostate and/or bladder) IPSS score: 0/0  Previous score: 1/2  Previous PVR: 86 mL.  Denies any dysuria, hematuria or suprapubic pain.  Denies any recent fevers, chills, nausea or vomiting.   He does not have a  family history of PCa. IPSS    Row Name 09/20/17 0800         International Prostate Symptom Score   How often have you had the sensation of not emptying your bladder?  Not at All     How often have you had to urinate less than every two hours?  Not at All     How often have you found you stopped and started again several times when you urinated?  Not at All     How often have you found it difficult to postpone urination?  Not at All     How often have you had a weak urinary stream?  Not at All     How often have you had to strain to start urination?  Not at All     How many times did you typically get up at night to urinate?  None     Total IPSS Score  0       Quality of Life due to urinary symptoms   If you were to spend the rest of your life with your urinary condition just the way it is now how would you feel about that?  Delighted        Score:  1-7 Mild 8-19 Moderate 20-35 Severe  Microscopic hematuria Patient completed a CTU in 08/2016 which was negative.  He did not complete cystoscopy.  He denies any gross hematuria.  His UA today has 0-5 RBC's and 0-5 WBC's.     PMH: Past Medical History:  Diagnosis Date  . Anxiety   . Depression   .  GERD (gastroesophageal reflux disease)   . 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  Home Medications:  Allergies as of 09/20/2017      Reactions   Meloxicam Other (See Comments)   Stomach pain      Medication List        Accurate as of 09/20/17  9:11 AM. Always use your most recent med list.          busPIRone 10 MG tablet Commonly known as:  BUSPAR TAKE 1 TABLET (10 MG TOTAL) BY MOUTH 2 (TWO) TIMES DAILY.   fexofenadine 180 MG tablet Commonly known as:  ALLEGRA TAKE 1 TABLET (180 MG TOTAL) BY MOUTH DAILY.   omeprazole 20 MG capsule Commonly known as:  PRILOSEC TAKE 2 CAPSULES (40 MG TOTAL) BY MOUTH DAILY.       Allergies:  Allergies  Allergen Reactions  . Meloxicam  Other (See Comments)    Stomach pain    Family History: Family History  Problem Relation Age of Onset  . Diabetes Maternal Grandmother   . Hypertension Maternal Grandmother   . Prostate cancer Neg Hx   . Kidney cancer Neg Hx   . Bladder Cancer Neg Hx     Social History:  reports that  has never smoked. His smokeless tobacco use includes chew. He reports that he drinks alcohol. He reports that he does not use drugs.  ROS: UROLOGY Frequent Urination?: No Hard to postpone urination?: No Burning/pain with urination?: No Get up at night to urinate?: No Leakage of urine?: No Urine stream starts and stops?: No Trouble starting stream?: No Do you have to strain to urinate?: No Blood in urine?: No Urinary tract infection?: No Sexually transmitted disease?: No Injury to kidneys or bladder?: No Painful intercourse?: No Weak stream?: No Erection problems?: No Penile pain?: No  Gastrointestinal Nausea?: No Vomiting?: No Indigestion/heartburn?: No Diarrhea?: No Constipation?: No  Constitutional Fever: No Night sweats?: No Weight loss?: No Fatigue?: No  Skin Skin rash/lesions?: No Itching?: No  Eyes Blurred vision?: No Double vision?: No  Ears/Nose/Throat Sore throat?: No Sinus problems?: No  Hematologic/Lymphatic Swollen glands?: No Easy bruising?: No  Cardiovascular Leg swelling?: No Chest pain?: No  Respiratory Cough?: No Shortness of breath?: No  Endocrine Excessive thirst?: No  Musculoskeletal Back pain?: No Joint pain?: No  Neurological Headaches?: No Dizziness?: No  Psychologic Depression?: No Anxiety?: No  Physical Exam: BP 137/68   Pulse 67   Ht 5' 10"  (1.778 m)   Wt 220 lb (99.8 kg)   BMI 31.57 kg/m   Constitutional: Well nourished. Alert and oriented, No acute distress. HEENT: [place] AT, moist mucus membranes. Trachea midline, no masses. Cardiovascular: No clubbing, cyanosis, or edema. Respiratory: Normal respiratory effort,  no increased work of breathing. GI: Abdomen is soft, non tender, non distended, no abdominal masses. Liver and spleen not palpable.  No hernias appreciated.  Stool sample for occult testing is not indicated.   GU: No CVA tenderness.  No bladder fullness or masses.  Patient with circumcised phallus.   Urethral meatus is patent.  No penile discharge. No penile lesions or rashes. Scrotum without lesions, cysts, rashes and/or edema.  Testicles are located scrotally bilaterally. No masses are appreciated in the testicles. Left and right epididymis are normal. Rectal: Patient with  normal sphincter tone. Anus and perineum without scarring or rashes. No rectal masses are appreciated. Prostate is approximately 45 grams, no nodules are appreciated. Seminal vesicles are normal. Skin:  No rashes, bruises or suspicious lesions. Lymph: No cervical or inguinal adenopathy. Neurologic: Grossly intact, no focal deficits, moving all 4 extremities. Psychiatric: Normal mood and affect.  Laboratory Data:  Lab Results  Component Value Date   PSA from [facility] 11/19/2014   Urinalysis 0-5 RBC's.  0-5 WBC's.  See EPIC.    Pertinent Imaging: CLINICAL DATA:  Microscopic hematuria  EXAM: CT ABDOMEN AND PELVIS WITHOUT AND WITH CONTRAST  TECHNIQUE: Multidetector CT imaging of the abdomen and pelvis was performed following the standard protocol before and following the bolus administration of intravenous contrast.  CONTRAST:  143m ISOVUE-300 IOPAMIDOL (ISOVUE-300) INJECTION 61%  COMPARISON:  None.  FINDINGS: Lower chest:  Lung bases are clear.  Hepatobiliary: No focal hepatic lesion. Normal gallbladder. No duct dilatation.  Pancreas: Normal pancreatic parenchymal intensity. No ductal dilatation or inflammation.  Spleen: Normal spleen.  Adrenals/urinary tract: Adrenal glands are normal. No nephrolithiasis or ureterolithiasis. No enhancing renal cortical lesion. No filling defects within the  ureters.  No bladder calculi, enhancing bladder lesions, or filling defect within the bladder.  Stomach/Bowel: Stomach and limited of the small bowel is unremarkable  Vascular/Lymphatic: Abdominal aortic normal caliber. No retroperitoneal periportal lymphadenopathy.  Musculoskeletal: No aggressive osseous lesion  IMPRESSION: 1. No explanation for hematuria. No nephrolithiasis, ureterolithiasis, enhancing renal cortical lesion, or filling defects within the collecting systems. 2. No bladder stones or filling defects in the bladder which does not excluded a bladder lesion.   Electronically Signed   By: SSuzy BouchardM.D.   On: 09/13/2016 15:23  Assessment & Plan:    1. Microscopic hematuria -UA is positive for 0-5 RBC's -completed CTU in 08/2016, no worrisome findings -still needs a cystoscopy to complete work up -advised patient that there is a  2% to 5% risk with microscopic hematuria of missing a bladder cancer - patient and his mother understand the risk - this is a difficult situation as he has MR and he is independent regarding his health care and he not wanting a cystoscopy at this time - mom will try to continue to encourage him to undergo a cystoscopy -UA upon return  -report any gross hematuria   2. BPH with LUTS  -I PSS score is 0/0, it is improving   -Continue conservative management, avoiding bladder irritants and timed voiding's  -RTC in 12 months for I PSS and exam      Return in about 1 year (around 09/21/2018) for UA, I PSS and exam.  These notes generated with voice recognition software. I apologize for typographical errors.  SZara Council [facility] [address] [place] [phone]

## 2017-09-20 NOTE — Patient Instructions (Addendum)
Hematuria, Adult Hematuria is blood in your urine. It can be caused by a bladder infection, kidney infection, prostate infection, kidney stone, or cancer of your urinary tract. Infections can usually be treated with medicine, and a kidney stone usually will pass through your urine. If neither of these is the cause of your hematuria, further workup to find out the reason may be needed. It is very important that you tell your health care provider about any blood you see in your urine, even if the blood stops without treatment or happens without causing pain. Blood in your urine that happens and then stops and then happens again can be a symptom of a very serious condition. Also, pain is not a symptom in the initial stages of many urinary cancers. Follow these instructions at home:  Drink lots of fluid, 3-4 quarts a day. If you have been diagnosed with an infection, cranberry juice is especially recommended, in addition to large amounts of water.  Avoid caffeine, tea, and carbonated beverages because they tend to irritate the bladder.  Avoid alcohol because it may irritate the prostate.  Take all medicines as directed by your health care provider.  If you were prescribed an antibiotic medicine, finish it all even if you start to feel better.  If you have been diagnosed with a kidney stone, follow your health care provider's instructions regarding straining your urine to catch the stone.  Empty your bladder often. Avoid holding urine for long periods of time.  After a bowel movement, women should cleanse front to back. Use each tissue only once.  Empty your bladder before and after sexual intercourse if you are a male. Contact a health care provider if:  You develop back pain.  You have a fever.  You have a feeling of sickness in your stomach (nausea) or vomiting.  Your symptoms are not better in 3 days. Return sooner if you are getting worse. Get help right away if:  You develop  severe vomiting and are unable to keep the medicine down.  You develop severe back or abdominal pain despite taking your medicines.  You begin passing a large amount of blood or clots in your urine.  You feel extremely weak or faint, or you pass out. This information is not intended to replace advice given to you by your health care provider. Make sure you discuss any questions you have with your health care provider. Document Released: 07/09/2005 Document Revised: 12/15/2015 Document Reviewed: 03/09/2013 Elsevier Interactive Patient Education  2017 Elsevier Inc.  Cystoscopy Cystoscopy is a procedure that is used to help diagnose and sometimes treat conditions that affect that lower urinary tract. The lower urinary tract includes the bladder and the tube that drains urine from the bladder out of the body (urethra). Cystoscopy is performed with a thin, tube-shaped instrument with a light and camera at the end (cystoscope). The cystoscope may be hard (rigid) or flexible, depending on the goal of the procedure.The cystoscope is inserted through the urethra, into the bladder. Cystoscopy may be recommended if you have:  Urinary tractinfections that keep coming back (recurring).  Blood in the urine (hematuria).  Loss of bladder control (urinary incontinence) or an overactive bladder.  Unusual cells found in a urine sample.  A blockage in the urethra.  Painful urination.  An abnormality in the bladder found during an intravenous pyelogram (IVP) or CT scan.  Cystoscopy may also be done to remove a sample of tissue to be examined under a microscope (biopsy). Tell   a health care provider about:  Any allergies you have.  All medicines you are taking, including vitamins, herbs, eye drops, creams, and over-the-counter medicines.  Any problems you or family members have had with anesthetic medicines.  Any blood disorders you have.  Any surgeries you have had.  Any medical conditions you  have.  Whether you are pregnant or may be pregnant. What are the risks? Generally, this is a safe procedure. However, problems may occur, including:  Infection.  Bleeding.  Allergic reactions to medicines.  Damage to other structures or organs.  What happens before the procedure?  Ask your health care provider about: ? Changing or stopping your regular medicines. This is especially important if you are taking diabetes medicines or blood thinners. ? Taking medicines such as aspirin and ibuprofen. These medicines can thin your blood. Do not take these medicines before your procedure if your health care provider instructs you not to.  Follow instructions from your health care provider about eating or drinking restrictions.  You may be given antibiotic medicine to help prevent infection.  You may have an exam or testing, such as X-rays of the bladder, urethra, or kidneys.  You may have urine tests to check for signs of infection.  Plan to have someone take you home after the procedure. What happens during the procedure?  To reduce your risk of infection,your health care team will wash or sanitize their hands.  You will be given one or more of the following: ? A medicine to help you relax (sedative). ? A medicine to numb the area (local anesthetic).  The area around the opening of your urethra will be cleaned.  The cystoscope will be passed through your urethra into your bladder.  Germ-free (sterile)fluid will flow through the cystoscope to fill your bladder. The fluid will stretch your bladder so that your surgeon can clearly examine your bladder walls.  The cystoscope will be removed and your bladder will be emptied. The procedure may vary among health care providers and hospitals. What happens after the procedure?  You may have some soreness or pain in your abdomen and urethra. Medicines will be available to help you.  You may have some blood in your urine.  Do not  drive for 24 hours if you received a sedative. This information is not intended to replace advice given to you by your health care provider. Make sure you discuss any questions you have with your health care provider. Document Released: 07/06/2000 Document Revised: 11/17/2015 Document Reviewed: 05/26/2015 Elsevier Interactive Patient Education  2018 Elsevier Inc.  

## 2017-09-20 NOTE — Telephone Encounter (Signed)
Patient's mom called asking for you to call her regarding his app this morning. She can be reached @ [phone]  [facility]

## 2017-09-24 ENCOUNTER — Telehealth: Payer: Self-pay | Admitting: Unknown Physician Specialty

## 2017-09-24 NOTE — Telephone Encounter (Signed)
Called and spoke to patient's mother. She states that Joshua Krause has had a time with his left knee. She states that he has bone on bone in his left knee and is going to the right. States he has gotten 3 shots to help put some stuff in that helps get the bone off bone. She states that the patient [facility] and she does not understand if the patient has any problems or not. She states that she would like for someone to look over the note and see what they think about it. Patient's mother advised that she may need to call the urologist for clarification since it is their note.

## 2017-09-24 NOTE — Telephone Encounter (Signed)
He should continue to follow with orthopedics for his knee- they will be able to help take care of it.   It looks like from Shannon's note, he had a UTI and it had resolved. I don't know why he had one. It looks like she thinks his prostate is enlarged, but everything else was normal. If he's having issues or is concerned about his urinary tract, she should call [facility]. Thanks!

## 2017-09-24 NOTE — Telephone Encounter (Signed)
Patient's mother notified of what Dr. Laural BenesJohnson said.

## 2017-09-24 NOTE — Telephone Encounter (Signed)
Copied from CRM [phone]. Topic: Quick Communication - See Telephone Encounter >> Sep 24, 2017 11:04 AM Eston Mouldavis, Isrrael Fluckiger B wrote: CRM for notification. See Telephone encounter for:   Pts mother would like Talmadge Coventryheryl Wicker's nurse to call her concerning the pts recent medical issues.  [phone]   09/24/17.

## 2017-09-25 NOTE — Telephone Encounter (Signed)
Spoke with Rinaldo CloudPamela.  See noted dated 09/20/2017

## 2017-10-13 IMAGING — CT CT ABD-PEL WO/W CM
4 of 12 series · 12 of 46 positions shown, 19 images · IV contrast (iopamidol)
Comparison: None.

CLINICAL DATA: Microscopic hematuria

EXAM:
CT ABDOMEN AND PELVIS WITHOUT AND WITH CONTRAST
TECHNIQUE: Multidetector CT imaging of the abdomen and pelvis was performed
following the standard protocol before and following the bolus
administration of intravenous contrast.
CONTRAST:  125mL 7VBMNU-D22 IOPAMIDOL (7VBMNU-D22) INJECTION 61%

[Series 2: hematuria > 45 wo · axial · 0.82mm/px · z∈[-806,-426]mm · 7 of 102 slices shown, 12 images]
[im 13/102  soft-tissue]
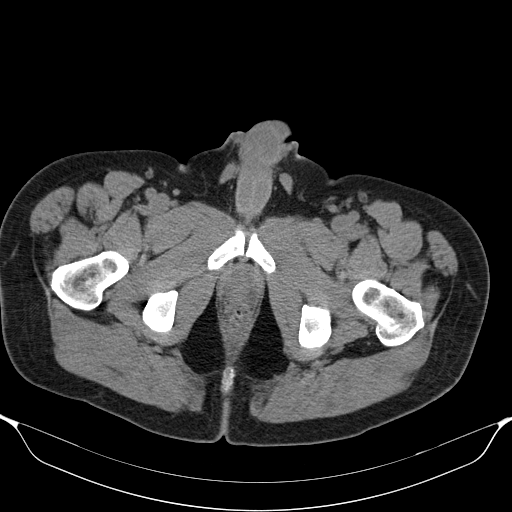
[im 13/102  bone]
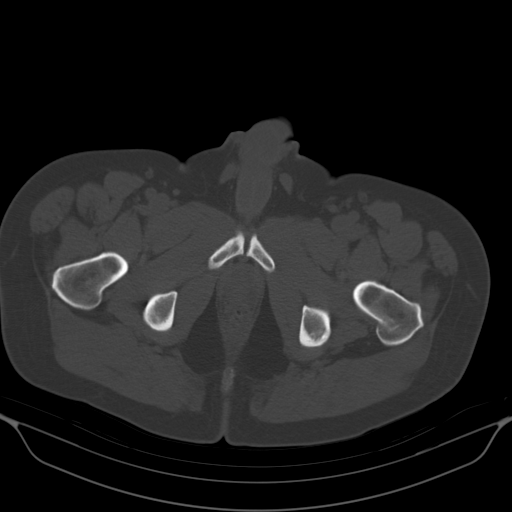
[im 26/102  soft-tissue]
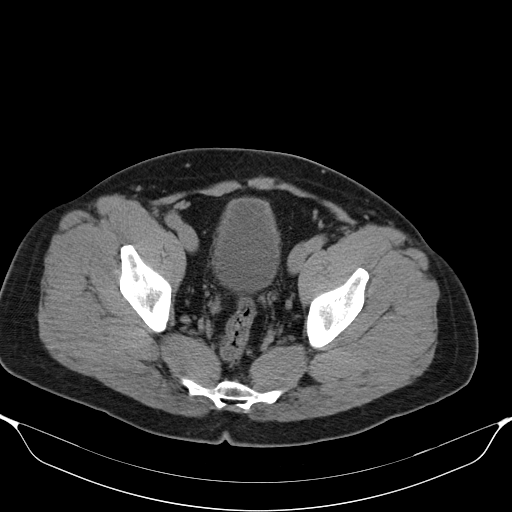
[im 38/102  soft-tissue]
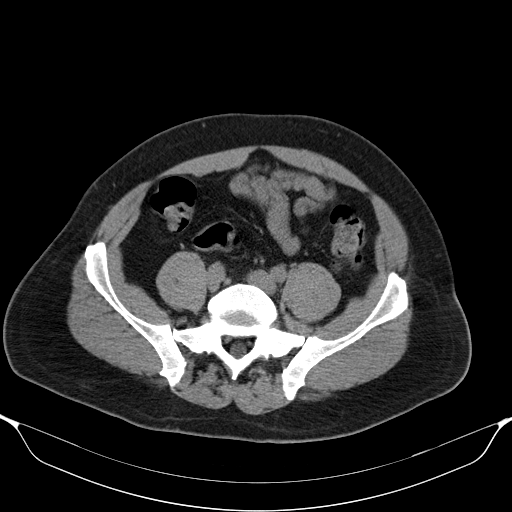
[im 51/102  soft-tissue]
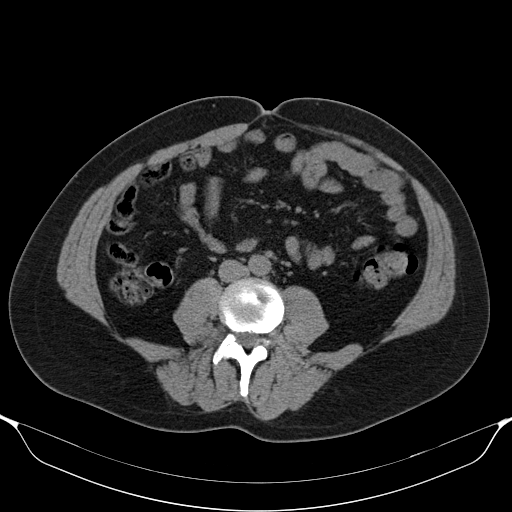
[im 51/102  lung]
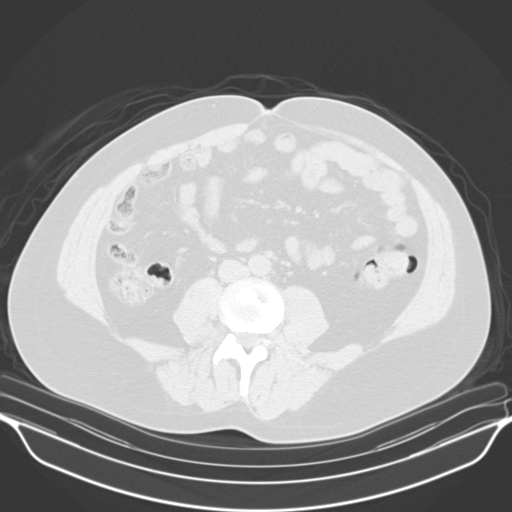
[im 64/102  soft-tissue]
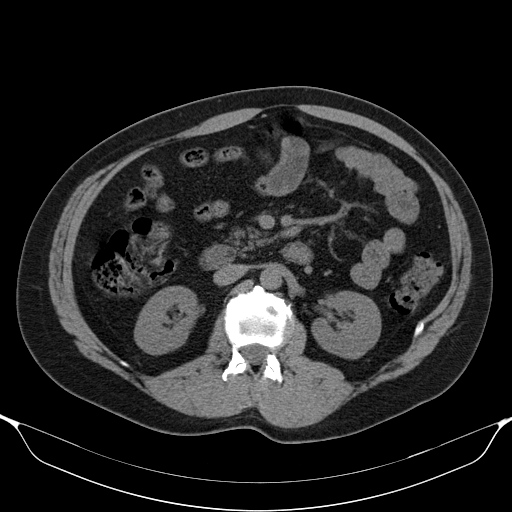
[im 64/102  lung]
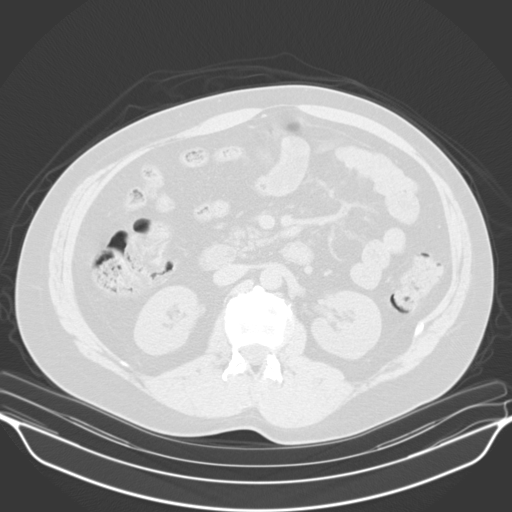
[im 76/102  soft-tissue]
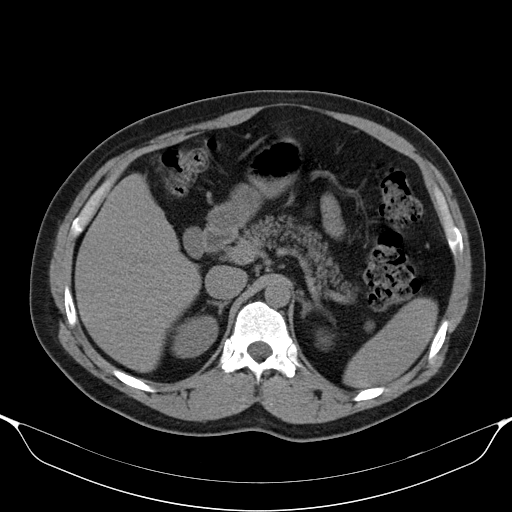
[im 76/102  lung]
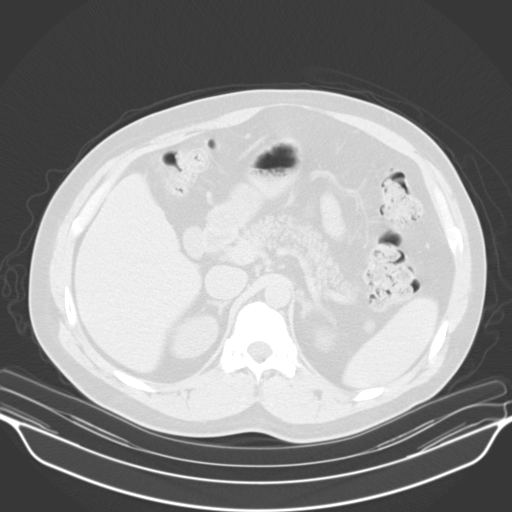
[im 89/102  soft-tissue]
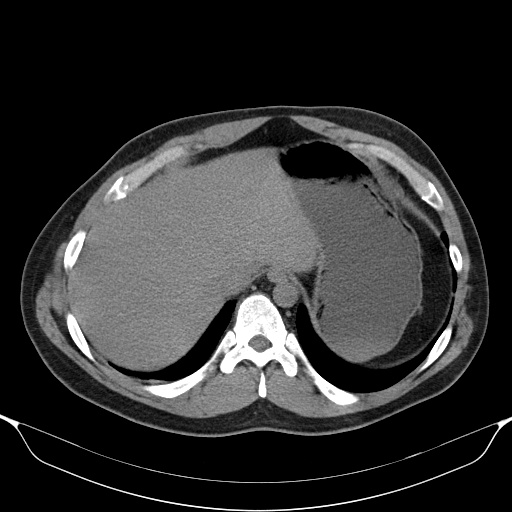
[im 89/102  lung]
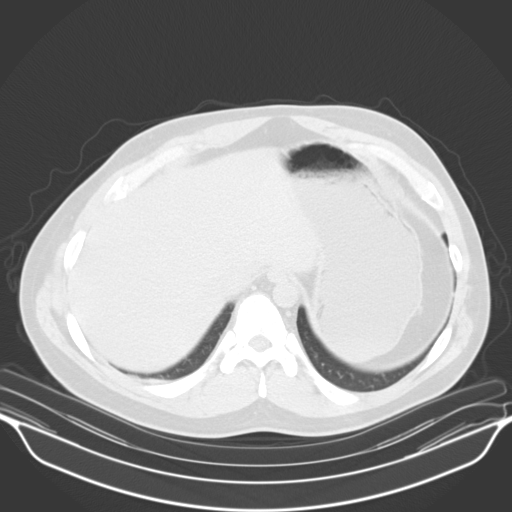

[Series 4: hematuria < 45 with 100s · axial · 0.82mm/px · z∈[-796,-726]mm · 2 of 102 slices shown]
[im 15/102  soft-tissue]
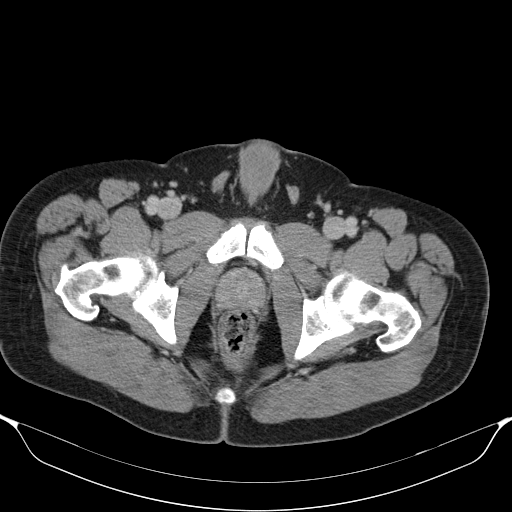
[im 29/102  soft-tissue]
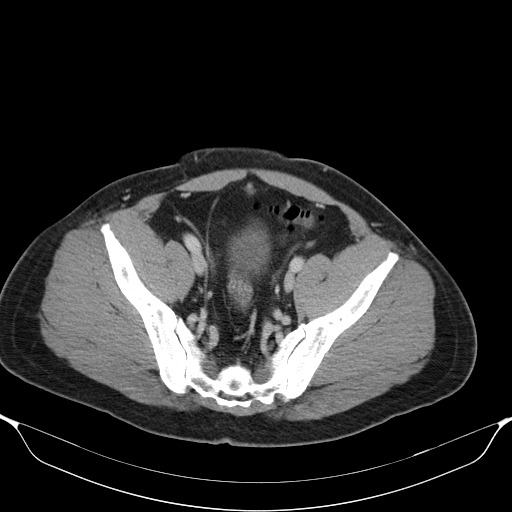

[Series 603: coronal wo · coronal · 0.99mm/px · 2 of 129 slices shown, 3 images]
[im 43/129  soft-tissue]
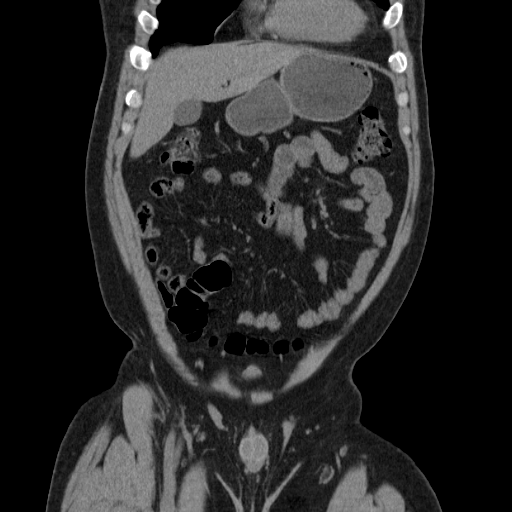
[im 43/129  bone]
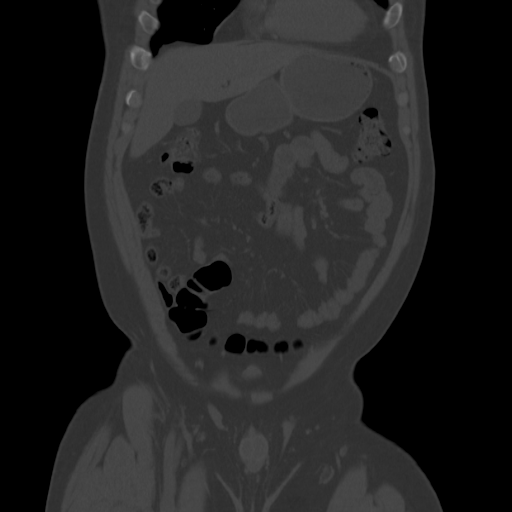
[im 86/129  soft-tissue]
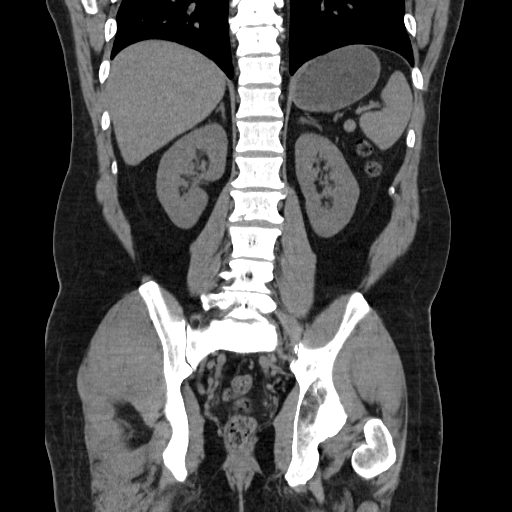

[Series 606: sagittal with · sagittal · 1.01mm/px · 1 of 166 slices shown, 2 images]
[im 12/166  soft-tissue]
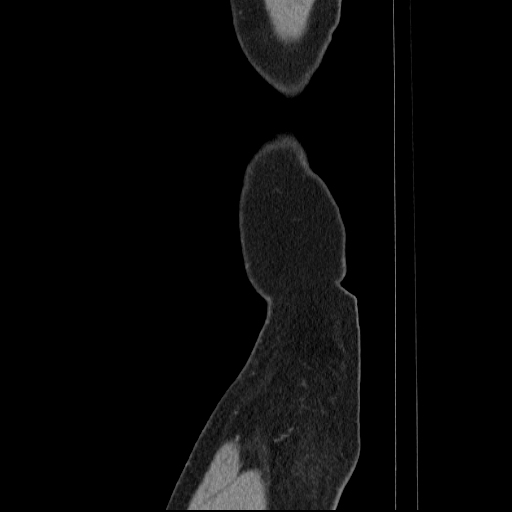
[im 12/166  bone]
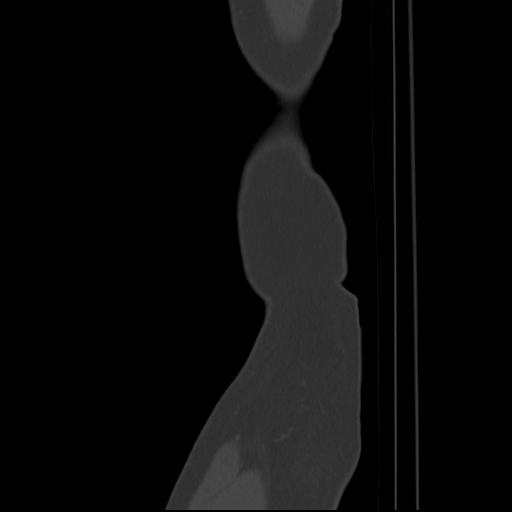

[12 of 46 positions shown; findings below may reference images not displayed]

FINDINGS: Lower chest:  Lung bases are clear.

Hepatobiliary: No focal hepatic lesion. Normal gallbladder. No duct
dilatation.

Pancreas: Normal pancreatic parenchymal intensity. No ductal
dilatation or inflammation.

Spleen: Normal spleen.

Adrenals/urinary tract: Adrenal glands are normal. No
nephrolithiasis or ureterolithiasis. No enhancing renal cortical
lesion. No filling defects within the ureters.

No bladder calculi, enhancing bladder lesions, or filling defect
within the bladder.

Stomach/Bowel: Stomach and limited of the small bowel is
unremarkable

Vascular/Lymphatic: Abdominal aortic normal caliber. No
retroperitoneal periportal lymphadenopathy.

Musculoskeletal: No aggressive osseous lesion
IMPRESSION: 1. No explanation for hematuria. No nephrolithiasis,
ureterolithiasis, enhancing renal cortical lesion, or filling
defects within the collecting systems.
2. No bladder stones or filling defects in the bladder which does
not excluded a bladder lesion.

## 2017-11-22 DIAGNOSIS — Q8500 Neurofibromatosis, unspecified: Secondary | ICD-10-CM | POA: Insufficient documentation

## 2017-11-22 DIAGNOSIS — F79 Unspecified intellectual disabilities: Secondary | ICD-10-CM | POA: Insufficient documentation

## 2017-12-28 ENCOUNTER — Other Ambulatory Visit: Payer: Self-pay | Admitting: Family Medicine

## 2017-12-30 NOTE — Telephone Encounter (Signed)
omeprazole refill Last Refill:07/15/17 # 180 NO RF Last OV: 08/16/16 (chart indicated pt is not taking) PCP: Gabriel Cirriheryl Wicker NP Pharmacy:[facility] [address]

## 2018-02-11 ENCOUNTER — Ambulatory Visit: Payer: BC Managed Care – PPO | Admitting: Unknown Physician Specialty

## 2018-02-11 ENCOUNTER — Encounter: Payer: Self-pay | Admitting: Unknown Physician Specialty

## 2018-02-11 VITALS — BP 119/73 | HR 85 | Temp 98.8°F | Ht 70.0 in | Wt 212.4 lb

## 2018-02-11 DIAGNOSIS — R140 Abdominal distension (gaseous): Secondary | ICD-10-CM | POA: Diagnosis not present

## 2018-02-11 NOTE — Progress Notes (Signed)
BP 119/73   Pulse 85   Temp 98.8 F (37.1 C) (Oral)   Ht 5\' 10"  (1.778 m)   Wt 212 lb 6.4 oz (96.3 kg)   SpO2 97%   BMI 30.48 kg/m    Subjective:    Patient ID: Joshua Krause, male    DOB: 05-03-1971, 47 y.o.   MRN: [id number]  HPI: Joshua Krause is a 47 y.o. male  Chief Complaint  Patient presents with  . Bloated    pt states that his mother wanted him to come in for stomach bloating, states he has not had any pain and no bloating now    Pt states his mom is concerned with stomach bloating.  He has no concerns and denies pain.  Can't name and precipitating factors.  She just notices at times and is concerned.  He notices no blood in stool, constipation, diarrhea, nausea, or vomiting.  No known family hx of colon cancer.  [facility] and Miralax was suggested.  This note was reviewed  Family History  Problem Relation Age of Onset  . Diabetes Maternal Grandmother   . Hypertension Maternal Grandmother   . Prostate cancer Neg Hx   . Kidney cancer Neg Hx   . Bladder Cancer Neg Hx     Relevant past medical, surgical, family and social history reviewed and updated as indicated. Interim medical history since our last visit reviewed. Allergies and medications reviewed and updated.  Review of Systems  Per HPI unless specifically indicated above     Objective:    BP 119/73   Pulse 85   Temp 98.8 F (37.1 C) (Oral)   Ht 5\' 10"  (1.778 m)   Wt 212 lb 6.4 oz (96.3 kg)   SpO2 97%   BMI 30.48 kg/m   Wt Readings from Last 3 Encounters:  02/11/18 212 lb 6.4 oz (96.3 kg)  09/20/17 220 lb (99.8 kg)  08/13/17 219 lb 4.8 oz (99.5 kg)    Physical Exam  Constitutional: He is oriented to person, place, and time. He appears well-developed and well-nourished. No distress.  HENT:  Head: Normocephalic and atraumatic.  Eyes: Conjunctivae and lids are normal. Right eye exhibits no discharge. Left eye exhibits no discharge. No  scleral icterus.  Neck: Normal range of motion. Neck supple. No JVD present. Carotid bruit is not present.  Cardiovascular: Normal rate, regular rhythm and normal heart sounds.  Pulmonary/Chest: Effort normal and breath sounds normal. No respiratory distress.  Abdominal: Soft. Normal appearance and bowel sounds are normal. He exhibits no distension and no mass. There is no splenomegaly or hepatomegaly. There is no tenderness. There is no rebound and no guarding. No hernia.  Musculoskeletal: Normal range of motion.  Neurological: He is alert and oriented to person, place, and time.  Skin: Skin is warm, dry and intact. No rash noted. No pallor.  Psychiatric: He has a normal mood and affect. His behavior is normal. Judgment and thought content normal.    Results for orders placed or performed during the hospital encounter of 09/20/17  Urinalysis, Complete w Microscopic  Result Value Ref Range   Color, Urine YELLOW YELLOW   APPearance CLEAR CLEAR   Specific Gravity, Urine 1.020 1.005 - 1.030   pH 6.5 5.0 - 8.0   Glucose, UA NEGATIVE NEGATIVE mg/dL   Hgb urine dipstick TRACE (A) NEGATIVE   Bilirubin Urine NEGATIVE NEGATIVE   Ketones, ur NEGATIVE NEGATIVE mg/dL  Protein, ur NEGATIVE NEGATIVE mg/dL   Nitrite NEGATIVE NEGATIVE   Leukocytes, UA NEGATIVE NEGATIVE   Squamous Epithelial / LPF NONE SEEN NONE SEEN   WBC, UA 0-5 0 - 5 WBC/hpf   RBC / HPF 0-5 0 - 5 RBC/hpf   Bacteria, UA NONE SEEN NONE SEEN   Mucus PRESENT       Assessment & Plan:   Problem List Items Addressed This Visit    None    Visit Diagnoses    Abdominal bloating    -  Primary   probably due to abdominal obesity.  No red flag symptoms.  Check CMP for liver enzymes and CBC.  If negative results, consider screening colonoscopy       Follow up plan: No follow-ups on file.

## 2018-02-12 ENCOUNTER — Encounter: Payer: Self-pay | Admitting: Unknown Physician Specialty

## 2018-02-12 LAB — COMPREHENSIVE METABOLIC PANEL
A/G RATIO: 1.8 (ref 1.2–2.2)
ALT: 20 IU/L (ref 0–44)
AST: 16 IU/L (ref 0–40)
Albumin: 4.4 g/dL (ref 3.5–5.5)
Alkaline Phosphatase: 86 IU/L (ref 39–117)
BILIRUBIN TOTAL: 0.5 mg/dL (ref 0.0–1.2)
BUN / CREAT RATIO: 9 (ref 9–20)
BUN: 8 mg/dL (ref 6–24)
CALCIUM: 9.4 mg/dL (ref 8.7–10.2)
CHLORIDE: 103 mmol/L (ref 96–106)
CO2: 24 mmol/L (ref 20–29)
Creatinine, Ser: 0.89 mg/dL (ref 0.76–1.27)
GFR, EST AFRICAN AMERICAN: 119 mL/min/{1.73_m2} (ref >59)
GFR, EST NON AFRICAN AMERICAN: 103 mL/min/{1.73_m2} (ref >59)
Globulin, Total: 2.5 g/dL (ref 1.5–4.5)
Glucose: 96 mg/dL (ref 65–99)
POTASSIUM: 4.1 mmol/L (ref 3.5–5.2)
SODIUM: 143 mmol/L (ref 134–144)
TOTAL PROTEIN: 6.9 g/dL (ref 6.0–8.5)

## 2018-02-12 LAB — CBC WITH DIFFERENTIAL/PLATELET
Basophils Absolute: 0.1 10*3/uL (ref 0.0–0.2)
Basos: 1 %
EOS (ABSOLUTE): 0.1 10*3/uL (ref 0.0–0.4)
EOS: 1 %
HEMATOCRIT: 43.7 % (ref 37.5–51.0)
Hemoglobin: 14.9 g/dL (ref 13.0–17.7)
IMMATURE GRANS (ABS): 0 10*3/uL (ref 0.0–0.1)
Immature Granulocytes: 0 %
Lymphocytes Absolute: 2.1 10*3/uL (ref 0.7–3.1)
Lymphs: 19 %
MCH: 29.9 pg (ref 26.6–33.0)
MCHC: 34.1 g/dL (ref 31.5–35.7)
MCV: 88 fL (ref 79–97)
MONOS ABS: 1.1 10*3/uL — AB (ref 0.1–0.9)
Monocytes: 10 %
NEUTROS ABS: 7.8 10*3/uL — AB (ref 1.4–7.0)
NEUTROS PCT: 69 %
PLATELETS: 328 10*3/uL (ref 150–450)
RBC: 4.99 x10E6/uL (ref 4.14–5.80)
RDW: 12.7 % (ref 12.3–15.4)
WBC: 11.2 10*3/uL — AB (ref 3.4–10.8)

## 2018-03-28 ENCOUNTER — Other Ambulatory Visit: Payer: Self-pay | Admitting: Unknown Physician Specialty

## 2018-03-28 ENCOUNTER — Other Ambulatory Visit: Payer: Self-pay | Admitting: *Deleted

## 2018-03-28 MED ORDER — OMEPRAZOLE 20 MG PO CPDR: 40.0000 mg | DELAYED_RELEASE_CAPSULE | Freq: Every day | ORAL | 0 refills | Status: DC

## 2018-07-04 ENCOUNTER — Other Ambulatory Visit: Payer: Self-pay | Admitting: Physician Assistant

## 2018-08-15 ENCOUNTER — Encounter: Payer: Self-pay | Admitting: Unknown Physician Specialty

## 2018-08-15 ENCOUNTER — Ambulatory Visit: Payer: BC Managed Care – PPO | Admitting: Unknown Physician Specialty

## 2018-08-15 ENCOUNTER — Ambulatory Visit: Payer: BC Managed Care – PPO | Admitting: Family Medicine

## 2018-08-15 VITALS — BP 131/78 | HR 91 | Temp 98.6°F | Ht 70.0 in | Wt 204.0 lb

## 2018-08-15 DIAGNOSIS — R6889 Other general symptoms and signs: Secondary | ICD-10-CM | POA: Diagnosis not present

## 2018-08-15 DIAGNOSIS — J111 Influenza due to unidentified influenza virus with other respiratory manifestations: Secondary | ICD-10-CM

## 2018-08-15 DIAGNOSIS — M1712 Unilateral primary osteoarthritis, left knee: Secondary | ICD-10-CM | POA: Insufficient documentation

## 2018-08-15 LAB — VERITOR FLU A/B WAIVED
Influenza A: POSITIVE — AB
Influenza B: NEGATIVE

## 2018-08-15 MED ORDER — GUAIFENESIN-CODEINE 100-10 MG/5ML PO SOLN: 10.0000 mL | Freq: Four times a day (QID) | ORAL | 0 refills | Status: DC | PRN

## 2018-08-15 MED ORDER — GUAIFENESIN-CODEINE 100-10 MG/5ML PO SOLN: 10.0000 mL | Freq: Three times a day (TID) | ORAL | 0 refills | Status: DC | PRN

## 2018-08-15 NOTE — Progress Notes (Signed)
BP 131/78   Pulse 91   Temp 98.6 F (37 C) (Oral)   Ht 5\' 10"  (1.778 m)   Wt 204 lb (92.5 kg)   SpO2 99%   BMI 29.27 kg/m    Subjective:    Patient ID: Joshua Martenshristopher Bryan Bielby, male    DOB: [birth year], 48 y.o.   MRN: [id number]  HPI: Joshua MartensChristopher Bryan Krause is a 48 y.o. male  Chief Complaint  Patient presents with  . Cough    Came home on Wednesday sick. Works at [employer].  Dry cough.    Cough  This is a new problem. Episode onset: 2 days ago. The problem occurs constantly. The cough is non-productive. Associated symptoms include chills, a fever, nasal congestion, rhinorrhea and a sore throat. Pertinent negatives include no chest pain, ear congestion, ear pain, headaches, heartburn, hemoptysis, myalgias, postnasal drip, rash, shortness of breath, sweats, weight loss or wheezing. Nothing aggravates the symptoms. He has tried nothing for the symptoms.     Relevant past medical, surgical, family and social history reviewed and updated as indicated. Interim medical history since our last visit reviewed. Allergies and medications reviewed and updated.  Review of Systems  Constitutional: Positive for chills and fever. Negative for weight loss.  HENT: Positive for rhinorrhea and sore throat. Negative for ear pain and postnasal drip.   Respiratory: Positive for cough. Negative for hemoptysis, shortness of breath and wheezing.   Cardiovascular: Negative for chest pain.  Gastrointestinal: Negative for heartburn.  Musculoskeletal: Negative for myalgias.  Skin: Negative for rash.  Neurological: Negative for headaches.    Per HPI unless specifically indicated above     Objective:    BP 131/78   Pulse 91   Temp 98.6 F (37 C) (Oral)   Ht 5\' 10"  (1.778 m)   Wt 204 lb (92.5 kg)   SpO2 99%   BMI 29.27 kg/m   Wt Readings from Last 3 Encounters:  08/15/18 204 lb (92.5 kg)  02/11/18 212 lb 6.4 oz (96.3 kg)  09/20/17 220 lb (99.8 kg)    Physical Exam Vitals signs and nursing  note reviewed.  Constitutional:      General: He is not in acute distress.    Appearance: Normal appearance. He is well-developed.  HENT:     Head: Normocephalic and atraumatic.     Right Ear: Tympanic membrane and ear canal normal.     Left Ear: Tympanic membrane and ear canal normal.     Nose: Rhinorrhea present.     Right Sinus: No maxillary sinus tenderness or frontal sinus tenderness.     Left Sinus: No maxillary sinus tenderness or frontal sinus tenderness.     Mouth/Throat:     Pharynx: Uvula midline.  Eyes:     General: Lids are normal. No scleral icterus.       Right eye: No discharge.        Left eye: No discharge.     Conjunctiva/sclera: Conjunctivae normal.  Neck:     Musculoskeletal: Neck supple.  Cardiovascular:     Rate and Rhythm: Normal rate and regular rhythm.     Heart sounds: Normal heart sounds.  Pulmonary:     Effort: Pulmonary effort is normal. No respiratory distress.     Breath sounds: Normal breath sounds.  Abdominal:     Palpations: There is no hepatomegaly or splenomegaly.  Musculoskeletal: Normal range of motion.  Skin:    General: Skin is warm and dry.     Coloration: Skin  is not pale.     Findings: No rash.  Neurological:     Mental Status: He is alert and oriented to person, place, and time.  Psychiatric:        Behavior: Behavior normal.        Thought Content: Thought content normal.        Judgment: Judgment normal.     Results for orders placed or performed in visit on 02/11/18  CBC with Differential/Platelet  Result Value Ref Range   WBC 11.2 (H) 3.4 - 10.8 x10E3/uL   RBC 4.99 4.14 - 5.80 x10E6/uL   Hemoglobin 14.9 13.0 - 17.7 g/dL   Hematocrit 41.6 60.6 - 51.0 %   MCV 88 79 - 97 fL   MCH 29.9 26.6 - 33.0 pg   MCHC 34.1 31.5 - 35.7 g/dL   RDW 30.1 60.1 - 09.3 %   Platelets 328 150 - 450 x10E3/uL   Neutrophils 69 Not Estab. %   Lymphs 19 Not Estab. %   Monocytes 10 Not Estab. %   Eos 1 Not Estab. %   Basos 1 Not Estab. %    Neutrophils Absolute 7.8 (H) 1.4 - 7.0 x10E3/uL   Lymphocytes Absolute 2.1 0.7 - 3.1 x10E3/uL   Monocytes Absolute 1.1 (H) 0.1 - 0.9 x10E3/uL   EOS (ABSOLUTE) 0.1 0.0 - 0.4 x10E3/uL   Basophils Absolute 0.1 0.0 - 0.2 x10E3/uL   Immature Granulocytes 0 Not Estab. %   Immature Grans (Abs) 0.0 0.0 - 0.1 x10E3/uL  Comprehensive metabolic panel  Result Value Ref Range   Glucose 96 65 - 99 mg/dL   BUN 8 6 - 24 mg/dL   Creatinine, Ser 2.35 0.76 - 1.27 mg/dL   GFR calc non Af Amer 103 >59 mL/min/1.73   GFR calc Af Amer 119 >59 mL/min/1.73   BUN/Creatinine Ratio 9 9 - 20   Sodium 143 134 - 144 mmol/L   Potassium 4.1 3.5 - 5.2 mmol/L   Chloride 103 96 - 106 mmol/L   CO2 24 20 - 29 mmol/L   Calcium 9.4 8.7 - 10.2 mg/dL   Total Protein 6.9 6.0 - 8.5 g/dL   Albumin 4.4 3.5 - 5.5 g/dL   Globulin, Total 2.5 1.5 - 4.5 g/dL   Albumin/Globulin Ratio 1.8 1.2 - 2.2   Bilirubin Total 0.5 0.0 - 1.2 mg/dL   Alkaline Phosphatase 86 39 - 117 IU/L   AST 16 0 - 40 IU/L   ALT 20 0 - 44 IU/L      Assessment & Plan:   Problem List Items Addressed This Visit    None    Visit Diagnoses    Flu-like symptoms    -  Primary   Relevant Orders   Veritor Flu A/B Waived   Influenza       Pt with positive flu test.  Will rx Robitussin with Codeine.  Note to be out of work for illness until Wednesday.  Tylenol or Ibuprofen for fever.         Follow up plan: Return if symptoms worsen or fail to improve.

## 2018-09-18 ENCOUNTER — Ambulatory Visit: Payer: BC Managed Care – PPO | Admitting: Urology

## 2018-09-27 ENCOUNTER — Other Ambulatory Visit: Payer: Self-pay | Admitting: Nurse Practitioner

## 2018-12-25 ENCOUNTER — Other Ambulatory Visit: Payer: Self-pay | Admitting: Nurse Practitioner

## 2018-12-25 NOTE — Telephone Encounter (Signed)
Requested Prescriptions  Pending Prescriptions Disp Refills  . omeprazole (PRILOSEC) 20 MG capsule [Pharmacy Med Name: OMEPRAZOLE DR 20 MG CAPSULE] 180 capsule 0    Sig: TAKE 2 CAPSULES BY MOUTH EVERY DAY     Gastroenterology: Proton Pump Inhibitors Passed - 12/25/2018  1:42 AM      Passed - Valid encounter within last 12 months    Recent Outpatient Visits          4 months ago Flu-like symptoms   [facility] Gabriel Cirri, NP   10 months ago Abdominal bloating   [facility] Gabriel Cirri, NP   1 year ago Upper respiratory tract infection, unspecified type   [facility] [place], [place]   2 years ago Cough   [facility] Gabriel Cirri, NP   2 years ago Need for diphtheria-tetanus-pertussis (Tdap) vaccine, adult/adolescent   [facility] Gabriel Cirri, NP

## 2019-03-20 ENCOUNTER — Other Ambulatory Visit: Payer: Self-pay | Admitting: Nurse Practitioner

## 2019-05-05 ENCOUNTER — Other Ambulatory Visit: Payer: Self-pay

## 2019-05-05 DIAGNOSIS — Z20822 Contact with and (suspected) exposure to covid-19: Secondary | ICD-10-CM

## 2019-05-07 ENCOUNTER — Telehealth: Payer: Self-pay | Admitting: General Practice

## 2019-05-07 LAB — NOVEL CORONAVIRUS, NAA: SARS-CoV-2, NAA: NOT DETECTED

## 2019-05-07 NOTE — Telephone Encounter (Signed)
Gave patient covid test results °Patient understood  °

## 2019-06-17 ENCOUNTER — Other Ambulatory Visit: Payer: Self-pay

## 2019-06-17 DIAGNOSIS — Z20822 Contact with and (suspected) exposure to covid-19: Secondary | ICD-10-CM

## 2019-06-18 LAB — NOVEL CORONAVIRUS, NAA: SARS-CoV-2, NAA: NOT DETECTED

## 2019-09-07 ENCOUNTER — Ambulatory Visit: Payer: BC Managed Care – PPO | Attending: Internal Medicine

## 2019-09-07 DIAGNOSIS — Z20822 Contact with and (suspected) exposure to covid-19: Secondary | ICD-10-CM

## 2019-09-08 LAB — NOVEL CORONAVIRUS, NAA: SARS-CoV-2, NAA: NOT DETECTED

## 2022-01-17 ENCOUNTER — Ambulatory Visit: Payer: BC Managed Care – PPO | Admitting: Nurse Practitioner

## 2022-01-17 ENCOUNTER — Ambulatory Visit: Payer: Self-pay | Admitting: *Deleted

## 2022-01-17 NOTE — Telephone Encounter (Signed)
This triage is continuing due to patient did not have visit as was planned and his mother has called back.

## 2022-01-17 NOTE — Telephone Encounter (Signed)
  Chief Complaint: cough productive Symptoms: clear phlegmn Frequency: very frequent Pertinent Negatives: Patient denies fever Disposition: [] ED /[] Urgent Care (no appt availability in office) / [x] Appointment(In office/virtual)/ []  [facility]/ [] Home Care/ [] Refused Recommended Disposition /[] [facility]/ []  Follow-up with PCP Additional Notes: Got an appt in 30 minutes so no advice given. Pt leaving for appt.   Reason for Disposition  [1] Patient also has allergy symptoms (e.g., itchy eyes, clear nasal discharge, postnasal drip) AND [2] they are acting up  Answer Assessment - Initial Assessment Questions 1. ONSET: "When did the cough begin?"      A couple of days 2. SEVERITY: "How bad is the cough today?"      worse 3. SPUTUM: "Describe the color of your sputum" (none, dry cough; clear, white, yellow, green)     No phlegm 4. HEMOPTYSIS: "Are you coughing up any blood?" If so ask: "How much?" (flecks, streaks, tablespoons, etc.)     no 5. DIFFICULTY BREATHING: "Are you having difficulty breathing?" If Yes, ask: "How bad is it?" (e.g., mild, moderate, severe)    - MILD: No SOB at rest, mild SOB with walking, speaks normally in sentences, can lie down, no retractions, pulse < 100.    - MODERATE: SOB at rest, SOB with minimal exertion and prefers to sit, cannot lie down flat, speaks in phrases, mild retractions, audible wheezing, pulse 100-120.    - SEVERE: Very SOB at rest, speaks in single words, struggling to breathe, sitting hunched forward, retractions, pulse > 120      no 6. FEVER: "Do you have a fever?" If Yes, ask: "What is your temperature, how was it measured, and when did it start?"     no 10. OTHER SYMPTOMS: "Do you have any other symptoms?" (e.g., runny nose, wheezing, chest pain)       Runny nose  Protocols used: Cough - Acute Non-Productive-A-AH

## 2022-01-17 NOTE — Telephone Encounter (Signed)
Unable to reach pt, will forward to practice.

## 2022-05-24 ENCOUNTER — Ambulatory Visit: Payer: Self-pay | Admitting: Nurse Practitioner

## 2022-05-24 NOTE — Progress Notes (Deleted)
   There were no vitals taken for this visit.   Subjective:    Patient ID: Joshua Krause, male    DOB: 01-15-1971, 51 y.o.   MRN: [id number]  HPI: Joshua Krause is a 51 y.o. male  No chief complaint on file.  Patient presents to clinic to establish care with new PCP.  Introduced to Designer, jewellery role and practice setting.  All questions answered.  Discussed provider/patient relationship and expectations.  Patient reports a history of ***. Patient denies a history of: Hypertension, Elevated Cholesterol, Diabetes, Thyroid problems, Depression, Anxiety, Neurological problems, and Abdominal problems.   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 Family History  Problem Relation Age of Onset   Diabetes Maternal Grandmother    Hypertension Maternal Grandmother    Prostate cancer Neg Hx    Kidney cancer Neg Hx    Bladder Cancer Neg Hx      Review of Systems  Per HPI unless specifically indicated above     Objective:    There were no vitals taken for this visit.  Wt Readings from Last 3 Encounters:  08/15/18 204 lb (92.5 kg)  02/11/18 212 lb 6.4 oz (96.3 kg)  09/20/17 220 lb (99.8 kg)    Physical Exam  Results for orders placed or performed in visit on 09/07/19  Novel Coronavirus, NAA ([facility])   Specimen: Nasopharyngeal(NP) swabs in vial transport medium   NASOPHARYNGE  TESTING  Result Value Ref Range   SARS-CoV-2, NAA Not Detected Not Detected      Assessment & Plan:   Problem List Items Addressed  This Visit   None    Follow up plan: No follow-ups on file.

## 2023-02-09 ENCOUNTER — Ambulatory Visit
Admission: RE | Admit: 2023-02-09 | Discharge: 2023-02-09 | Disposition: A | Discharge status: Home / Self Care | Payer: BC Managed Care – PPO | Source: Ambulatory Visit | Attending: Internal Medicine | Admitting: Internal Medicine

## 2023-02-09 ENCOUNTER — Ambulatory Visit
Admission: EM | Admit: 2023-02-09 | Discharge: 2023-02-09 | Disposition: A | Discharge status: Home / Self Care | Payer: BC Managed Care – PPO | Attending: Internal Medicine | Admitting: Internal Medicine

## 2023-02-09 ENCOUNTER — Ambulatory Visit
Admission: RE | Admit: 2023-02-09 | Discharge: 2023-02-09 | Disposition: A | Discharge status: Home / Self Care | Payer: BC Managed Care – PPO | Source: Ambulatory Visit | Attending: *Deleted | Admitting: *Deleted

## 2023-02-09 DIAGNOSIS — M79675 Pain in left toe(s): Secondary | ICD-10-CM | POA: Diagnosis not present

## 2023-02-09 MED ORDER — TRAMADOL HCL 50 MG PO TABS: 50.0000 mg | ORAL_TABLET | Freq: Four times a day (QID) | ORAL | 0 refills | Status: AC | PRN

## 2023-02-09 NOTE — Discharge Instructions (Addendum)
We will call you when your xray report is done If the xray is normal, then follow up with [facility] next week.

## 2023-02-09 NOTE — ED Triage Notes (Signed)
Pt reports having left great toe pain and swelling.  Started: 7/1  Home interventions: tylenol

## 2023-02-09 NOTE — ED Provider Notes (Signed)
[facility]    CSN: [id number] Arrival date & time: 02/09/23  1341      History   Chief Complaint Chief Complaint  Patient presents with   Toe Pain    HPI Joshua Krause is a 52 y.o. male who presents with L great toe swelling and pain x 20 days. States he injured it and rolled over and was thinking he would get better, and the pain has not been so bad. But today the dog walked on his toe, and caused severe pain. He was advised to come in to be seen.      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 No pertinent surgical history.     Home Medications    Prior to Admission medications   Medication Sig Start Date End Date Taking? Authorizing Provider  traMADol (ULTRAM) 50 MG tablet Take 1 tablet (50 mg total) by mouth every 6 (six) hours as needed. 02/09/23  Yes Rodriguez-Southworth, Nettie Elm, PA-C    Family History Family History  Problem Relation Age of Onset   Diabetes Maternal Grandmother    Hypertension Maternal Grandmother    Prostate cancer Neg Hx    Kidney cancer Neg Hx    Bladder Cancer Neg Hx     Social History Social History   Tobacco Use   Smoking status: Never   Smokeless tobacco: Current    Types: Chew  Substance Use Topics   Alcohol use: Yes    Alcohol/week: 0.0 standard drinks of alcohol    Comment: on occasion   Drug use: No     Allergies   Meloxicam   Review of Systems Review of Systems As noted in HPI  Physical Exam Triage Vital Signs ED Triage Vitals  Encounter Vitals Group     BP 02/09/23 1510 125/77     Systolic BP Percentile --      Diastolic BP Percentile --      Pulse Rate 02/09/23 1510 88     Resp 02/09/23 1510 18     Temp  02/09/23 1510 97.9 F (36.6 C)     Temp Source 02/09/23 1510 Oral     SpO2 02/09/23 1510 96 %     Weight --      Height --      Head Circumference --      Peak Flow --      Pain Score 02/09/23 1508 1     Pain Loc --      Pain Education --      Exclude from Growth Chart --    No data found.  Updated Vital Signs BP 125/77 (BP Location: Right Arm)   Pulse 88   Temp 97.9 F (36.6 C) (Oral)   Resp 18   SpO2 96%   Visual Acuity Right Eye Distance:   Left Eye Distance:   Bilateral Distance:    Right Eye Near:   Left Eye Near:    Bilateral Near:     Physical Exam Vitals and nursing note reviewed.  Constitutional:      General: He is not in acute distress.    Appearance: He is normal weight. He is not toxic-appearing.  HENT:     Right Ear: External ear normal.  Left Ear: External ear normal.     Nose: Nose normal.  Eyes:     General: No scleral icterus.    Conjunctiva/sclera: Conjunctivae normal.  Pulmonary:     Effort: Pulmonary effort is normal.  Musculoskeletal:     Cervical back: Neck supple.     Comments: L great toe is larger than L, has erythema, but no warmth. ROM is normal. Has minimal pain on the mid joint region.   Skin:    General: Skin is warm and dry.     Findings: No bruising.  Neurological:     Mental Status: He is alert and oriented to person, place, and time.     Gait: Gait normal.  Psychiatric:        Mood and Affect: Mood normal.        Behavior: Behavior normal.      UC Treatments / Results  Labs (all labs ordered are listed, but only abnormal results are displayed) Labs Reviewed - No data to display  EKG   Radiology No results found.  Procedures Procedures (including critical care time)  Medications Ordered in UC Medications - No data to display  Initial Impression / Assessment and Plan / UC Course  I have reviewed the triage vital signs and the nursing notes.  L great toe pain and swelling since prior injury  He was  sent to [facility] for xray since we had to close our Xray room due to a pt with possible TB. We will call him when the results are back.  Final Clinical Impressions(s) / UC Diagnoses   Final diagnoses:  Great toe pain, left     Discharge Instructions      We will call you when your xray report is done If the xray is normal, then follow up with [facility] next week.      ED Prescriptions     Medication Sig Dispense Auth. Provider   traMADol (ULTRAM) 50 MG tablet Take 1 tablet (50 mg total) by mouth every 6 (six) hours as needed. 10 tablet Rodriguez-Southworth, Nettie Elm, PA-C      I have reviewed the PDMP during this encounter.   Garey Ham, [place] 02/09/23 1643

## 2023-02-14 ENCOUNTER — Telehealth: Payer: Self-pay | Admitting: Internal Medicine

## 2023-02-14 NOTE — Telephone Encounter (Signed)
I called pt to see if he had heard from anyone about his xray results and he said yes, and has been fallowing up with [facility].
